# Patient Record
Sex: Female | Born: 2000 | Race: Black or African American | Hispanic: No | Marital: Single | Smoking: Never smoker
Health system: Southern US, Community
[De-identification: ages and names within clinical notes are randomized; demographics above are authoritative.]

## PROBLEM LIST (undated history)

## (undated) DIAGNOSIS — R569 Unspecified convulsions: Secondary | ICD-10-CM

## (undated) HISTORY — PX: BREAST CYST EXCISION: SHX579

## (undated) HISTORY — DX: Unspecified convulsions: R56.9

---
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
## 2019-11-30 ENCOUNTER — Other Ambulatory Visit: Payer: Self-pay

## 2019-11-30 ENCOUNTER — Emergency Department (HOSPITAL_BASED_OUTPATIENT_CLINIC_OR_DEPARTMENT_OTHER)
Admission: EM | Admit: 2019-11-30 | Discharge: 2019-11-30 | Disposition: A | Discharge status: Home / Self Care | Payer: BC Managed Care – PPO | Attending: Emergency Medicine | Admitting: Emergency Medicine

## 2019-11-30 ENCOUNTER — Encounter (HOSPITAL_BASED_OUTPATIENT_CLINIC_OR_DEPARTMENT_OTHER): Payer: Self-pay

## 2019-11-30 DIAGNOSIS — Z20822 Contact with and (suspected) exposure to covid-19: Secondary | ICD-10-CM | POA: Diagnosis not present

## 2019-11-30 DIAGNOSIS — R110 Nausea: Secondary | ICD-10-CM | POA: Insufficient documentation

## 2019-11-30 DIAGNOSIS — R42 Dizziness and giddiness: Secondary | ICD-10-CM

## 2019-11-30 DIAGNOSIS — R350 Frequency of micturition: Secondary | ICD-10-CM | POA: Diagnosis not present

## 2019-11-30 DIAGNOSIS — N3000 Acute cystitis without hematuria: Secondary | ICD-10-CM

## 2019-11-30 LAB — URINALYSIS, ROUTINE W REFLEX MICROSCOPIC
Bilirubin Urine: NEGATIVE
Glucose, UA: NEGATIVE mg/dL
Hgb urine dipstick: NEGATIVE
Ketones, ur: NEGATIVE mg/dL
Nitrite: NEGATIVE
Protein, ur: NEGATIVE mg/dL
Specific Gravity, Urine: 1.015 (ref 1.005–1.030)
pH: 8 (ref 5.0–8.0)

## 2019-11-30 LAB — CBC WITH DIFFERENTIAL/PLATELET
Abs Immature Granulocytes: 0.02 10*3/uL (ref 0.00–0.07)
Basophils Absolute: 0.1 10*3/uL (ref 0.0–0.1)
Basophils Relative: 1 %
Eosinophils Absolute: 0.1 10*3/uL (ref 0.0–0.5)
Eosinophils Relative: 2 %
HCT: 37.3 % (ref 36.0–46.0)
Hemoglobin: 12.9 g/dL (ref 12.0–15.0)
Immature Granulocytes: 0 %
Lymphocytes Relative: 18 %
Lymphs Abs: 1.2 10*3/uL (ref 0.7–4.0)
MCH: 28.5 pg (ref 26.0–34.0)
MCHC: 34.6 g/dL (ref 30.0–36.0)
MCV: 82.5 fL (ref 80.0–100.0)
Monocytes Absolute: 0.4 10*3/uL (ref 0.1–1.0)
Monocytes Relative: 7 %
Neutro Abs: 4.6 10*3/uL (ref 1.7–7.7)
Neutrophils Relative %: 72 %
Platelets: 199 10*3/uL (ref 150–400)
RBC: 4.52 MIL/uL (ref 3.87–5.11)
RDW: 11.6 % (ref 11.5–15.5)
WBC: 6.3 10*3/uL (ref 4.0–10.5)
nRBC: 0 % (ref 0.0–0.2)

## 2019-11-30 LAB — URINALYSIS, MICROSCOPIC (REFLEX)

## 2019-11-30 LAB — BASIC METABOLIC PANEL
Anion gap: 8 (ref 5–15)
BUN: 7 mg/dL (ref 6–20)
CO2: 24 mmol/L (ref 22–32)
Calcium: 9.1 mg/dL (ref 8.9–10.3)
Chloride: 104 mmol/L (ref 98–111)
Creatinine, Ser: 0.86 mg/dL (ref 0.44–1.00)
GFR, Estimated: >60 mL/min (ref >60)
Glucose, Bld: 91 mg/dL (ref 70–99)
Potassium: 3.8 mmol/L (ref 3.5–5.1)
Sodium: 136 mmol/L (ref 135–145)

## 2019-11-30 LAB — RESPIRATORY PANEL BY RT PCR (FLU A&B, COVID)
Influenza A by PCR: NEGATIVE
Influenza B by PCR: NEGATIVE
SARS Coronavirus 2 by RT PCR: NEGATIVE

## 2019-11-30 LAB — PREGNANCY, URINE: Preg Test, Ur: NEGATIVE

## 2019-11-30 MED ORDER — CEPHALEXIN 500 MG PO CAPS: 500.0000 mg | ORAL_CAPSULE | Freq: Two times a day (BID) | ORAL | 0 refills | Status: AC

## 2019-11-30 NOTE — Discharge Instructions (Addendum)
You were seen and evaluated in the emergency department today for your concern of dizziness.  Your vital signs, EKG, and blood work for very reassuring.  You are not anemic at this time.  Your urine test did show some infection in your urine.You have been prescribed 5 days of antibiotics outpatient.  Please complete the entire course.  You may follow-up with your primary care doctor if you are having persistent urinary symptoms plan.  Please return immediately to the emergency department if develop any new chest pain, shortness of breath, passing out, or other new severe symptoms.

## 2019-11-30 NOTE — ED Provider Notes (Signed)
[facility] EMERGENCY DEPARTMENT Provider Note   CSN: [id number] Arrival date & time: 11/30/19  5883     History No chief complaint on file.     Meghan Benton is a 19 y.o. female who presents with concern of waking up at 6:00 feeling very dizzy, nauseous for approximately 35 minutes this morning.  She states she dry heaved several times, but denies vomiting, diarrhea. Denies blurry vision / double vision.  She denies fevers and chills at home.  She denies dizziness or lightheadedness at this time.  She states she recently completed her menstrual cycle 11/20/2019.  She has history of iron deficiency anemia, secondary to heavy menstrual cycles.  She does take ferrous sulfate at home.   She states her symptoms have completely resolved at this time, and have not recurred since they stopped approximately 6:35 this morning (total duration 35 minutes).  However she presented to the emergency department for evaluation because when she has felt like this in the past she has subsequently had a "seizure."  She does endorse urinary frequency, denies urgency, denies dysuria.  Denies vaginal discharge or bleeding at this time.  She states she uses a contraceptive patch, she is currently sexually active.   I personally reviewed this patient's medical records.  She he has been seen multiple times for heavy menstrual cycles in the past, endorses vomiting with menstrual cycle.  History of seizure 2017 after feeling dizzy in the shower.  She was evaluated by pediatric neurology at that time.  She had a normal EEG at that time was not placed on any sort of anticonvulsant medication.  Neurologist recommended reducing her caffeine, started her on iron supplements.  She also had a normal work-up at the pediatric Cardiologist at that time.  HPI     Past Medical History:  Diagnosis Date  . Seizures (HCC)     There are no problems to display for this patient.   Past Surgical History:  Procedure  Laterality Date  . BREAST CYST EXCISION       OB History   No obstetric history on file.     Family History  Problem Relation Age of Onset  . Migraines Neg Hx   . Seizures Neg Hx   . Depression Neg Hx   . Anxiety disorder Neg Hx   . Bipolar disorder Neg Hx   . Schizophrenia Neg Hx   . ADD / ADHD Neg Hx   . Autism Neg Hx     Social History   Tobacco Use  . Smoking status: Never Smoker  . Smokeless tobacco: Never Used  Vaping Use  . Vaping Use: Never used  Substance Use Topics  . Alcohol use: No  . Drug use: No    Home Medications Prior to Admission medications   Medication Sig Start Date End Date Taking? Authorizing Provider  ferrous sulfate 325 (65 FE) MG EC tablet Take 2 tablets (650 mg total) by mouth daily with breakfast. 01/13/16   Lorenz Coaster, MD    Allergies    Patient has no known allergies.  Review of Systems   Review of Systems  Constitutional: Negative for activity change, appetite change, chills, diaphoresis, fatigue and fever.  HENT: Negative.   Respiratory: Negative for cough, chest tightness, shortness of breath and wheezing.   Cardiovascular: Negative for chest pain, palpitations and leg swelling.  Gastrointestinal: Positive for nausea. Negative for abdominal pain, diarrhea and vomiting.  Genitourinary: Positive for frequency. Negative for dysuria, flank pain, genital  sores, hematuria, urgency, vaginal bleeding, vaginal discharge and vaginal pain.  Musculoskeletal: Negative.  Negative for gait problem.  Skin: Negative.   Neurological: Positive for dizziness and light-headedness. Negative for seizures, syncope, weakness and headaches.  Hematological: Negative.     Physical Exam Updated Vital Signs BP 139/87 (BP Location: Right Arm)   Pulse 85   Temp 98.4 F (36.9 C)   Resp 16   Ht 5\' 7"  (1.702 m)   Wt 72.6 kg   LMP 11/16/2019   SpO2 99%   BMI 25.06 kg/m   Physical Exam Vitals and nursing note reviewed.  Constitutional:       Appearance: Normal appearance.  HENT:     Head: Normocephalic and atraumatic.     Nose: Nose normal.     Mouth/Throat:     Mouth: Mucous membranes are moist.     Pharynx: No oropharyngeal exudate or posterior oropharyngeal erythema.  Eyes:     General:        Right eye: No discharge.        Left eye: No discharge.     Extraocular Movements: Extraocular movements intact.     Conjunctiva/sclera: Conjunctivae normal.     Pupils: Pupils are equal, round, and reactive to light.  Cardiovascular:     Rate and Rhythm: Normal rate and regular rhythm.     Pulses: Normal pulses.     Heart sounds: Normal heart sounds. No murmur heard.   Pulmonary:     Effort: Pulmonary effort is normal. No respiratory distress.     Breath sounds: Normal breath sounds. No wheezing or rales.  Abdominal:     General: There is no distension.     Palpations: Abdomen is soft.     Tenderness: There is no abdominal tenderness. There is no guarding or rebound.  Musculoskeletal:        General: No deformity. Normal range of motion.     Cervical back: Neck supple. No rigidity.     Right lower leg: No edema.     Left lower leg: No edema.  Lymphadenopathy:     Cervical: No cervical adenopathy.  Skin:    General: Skin is warm and dry.     Capillary Refill: Capillary refill takes less than 2 seconds.     Coloration: Skin is not pale.     Findings: No bruising or rash.  Neurological:     General: No focal deficit present.     Mental Status: She is alert and oriented to person, place, and time.     Cranial Nerves: Cranial nerves are intact.     Sensory: Sensation is intact.     Motor: Motor function is intact.     Coordination: Coordination is intact.     Gait: Gait is intact.  Psychiatric:        Mood and Affect: Mood normal.     ED Results / Procedures / Treatments   Labs (all labs ordered are listed, but only abnormal results are displayed) Labs Reviewed  RESPIRATORY PANEL BY RT PCR (FLU A&B, COVID)    PREGNANCY, URINE  URINALYSIS, ROUTINE W REFLEX MICROSCOPIC  CBC WITH DIFFERENTIAL/PLATELET  BASIC METABOLIC PANEL    EKG None  Radiology No results found.  Procedures Procedures (including critical care time)  Medications Ordered in ED Medications - No data to display  ED Course  I have reviewed the triage vital signs and the nursing notes.  Pertinent labs & imaging results that were available during my care  of the patient were reviewed by me and considered in my medical decision making (see chart for details).    MDM Rules/Calculators/A&P                          19 year old female with history of iron deficiency anemia, concern for approximately 35 minutes of dizziness and nausea this morning.   Vital signs are normal on intake.  EKG normal sinus rhythm, no STEMI.  Cardiopulmonary exam is normal.  Neurologic exam is normal.  Physical exam is very reassuring.  CBC unremarkable, hemoglobin 12.9.  BMP unremarkable.  Pregnancy test negative  Respiratory pathogen panel negative for COVID-19, influenza A/B.  Urinalysis with trace leukocytes, many bacteria. Will treat with antibiotic for symptomatic bacteriuria, UTI.    Cardiac work-up is very reassuring.  At this time I do not feel any further work-up is necessary in the emergency department.  [place] voiced understanding of her medical evaluation and treatment plan.  Each of her questions were answered to her expressed satisfaction.  Encouraged her to follow-up with her primary care doctor if she continues to have urinary symptoms following completion of her antibiotic therapy.  Vital signs remained normal throughout the patient's stay in the emergency department.  Patient is stable for discharge.  Final Clinical Impression(s) / ED Diagnoses Final diagnoses:  None    Rx / DC Orders ED Discharge Orders    None       Sherrilee Gilles 11/30/19 2114    Terrilee Files, MD 12/01/19 [phone]

## 2019-11-30 NOTE — ED Notes (Signed)
Review D/C papers with pt, reviewed Rx with pt, pt states understanding, pt denies questions at this time. 

## 2019-11-30 NOTE — ED Triage Notes (Signed)
Pt complaint of after waking up this morning, pt complaint of feeling weak and dizzy that passed.

## 2019-12-02 LAB — URINE CULTURE

## 2019-12-03 LAB — URINE CULTURE: Culture: 70000 — AB

## 2019-12-04 ENCOUNTER — Telehealth: Payer: Self-pay | Admitting: *Deleted

## 2019-12-04 NOTE — Telephone Encounter (Signed)
Post ED Visit - Positive Culture Follow-up  Culture report reviewed by antimicrobial stewardship pharmacist: [facility] []  , Pharm.D. []  Enzo Bi, Pharm.D., BCPS AQ-ID []  , Pharm.D., BCPS []  Celedonio Miyamoto, Pharm.D., BCPS []  [place], Garvin Fila.D., BCPS, AAHIVP []  , Pharm.D., BCPS, AAHIVP []  Georgina Pillion, PharmD, BCPS []  , PharmD, BCPS []  [place], PharmD, BCPS []  [address], PharmD []  , PharmD, BCPS []  Estella Husk, PharmD  Pharmacy Team []  Lysle Pearl, PharmD []  , PharmD []  Phillips Climes, PharmD []  , Rph []  Agapito Games) , PharmD []  Verlan Friends, PharmD []  , PharmD []  Mervyn Gay, PharmD []  , PharmD []  Vinnie Level, PharmD []  [facility], PharmD []  , PharmD []  Len Childs, PharmD   Positive urine culture Treated with Cephalexin, organism sensitive to the same and no further patient follow-up is required at this time. , PharmD  Greer Pickerel Talley 12/04/2019, 8:40 AM

## 2019-12-11 ENCOUNTER — Emergency Department (HOSPITAL_BASED_OUTPATIENT_CLINIC_OR_DEPARTMENT_OTHER)
Admission: EM | Admit: 2019-12-11 | Discharge: 2019-12-11 | Disposition: A | Discharge status: Home / Self Care | Payer: BC Managed Care – PPO | Attending: Emergency Medicine | Admitting: Emergency Medicine

## 2019-12-11 ENCOUNTER — Encounter (HOSPITAL_BASED_OUTPATIENT_CLINIC_OR_DEPARTMENT_OTHER): Payer: Self-pay | Admitting: Emergency Medicine

## 2019-12-11 ENCOUNTER — Other Ambulatory Visit: Payer: Self-pay

## 2019-12-11 DIAGNOSIS — R112 Nausea with vomiting, unspecified: Secondary | ICD-10-CM | POA: Insufficient documentation

## 2019-12-11 DIAGNOSIS — N946 Dysmenorrhea, unspecified: Secondary | ICD-10-CM | POA: Insufficient documentation

## 2019-12-11 LAB — URINALYSIS, ROUTINE W REFLEX MICROSCOPIC
Glucose, UA: NEGATIVE mg/dL
Ketones, ur: 15 mg/dL — AB
Leukocytes,Ua: NEGATIVE
Nitrite: NEGATIVE
Protein, ur: NEGATIVE mg/dL
Specific Gravity, Urine: 1.02 (ref 1.005–1.030)
pH: 6.5 (ref 5.0–8.0)

## 2019-12-11 LAB — URINALYSIS, MICROSCOPIC (REFLEX)

## 2019-12-11 LAB — PREGNANCY, URINE: Preg Test, Ur: NEGATIVE

## 2019-12-11 MED ORDER — PROMETHAZINE HCL 25 MG PO TABS: 25.0000 mg | ORAL_TABLET | Freq: Three times a day (TID) | ORAL | 0 refills | Status: DC | PRN

## 2019-12-11 MED ORDER — ONDANSETRON HCL 4 MG/2ML IJ SOLN
4.0000 mg | Freq: Once | INTRAMUSCULAR | Status: AC
  Administered 2019-12-11: 4 mg via INTRAVENOUS
  Filled 2019-12-11: qty 2

## 2019-12-11 MED ORDER — SODIUM CHLORIDE 0.9 % IV BOLUS
1000.0000 mL | Freq: Once | INTRAVENOUS | Status: AC
  Administered 2019-12-11: 1000 mL via INTRAVENOUS

## 2019-12-11 NOTE — ED Provider Notes (Signed)
[facility] EMERGENCY DEPARTMENT Provider Note   CSN: [id number] Arrival date & time: 12/11/19  5397     History Chief Complaint  Patient presents with  . Emesis    Meghan Benton is a 19 y.o. female.  HPI Patient presents with nausea and vomiting.  Has been seen twice for this at [facility].  States that on her menses she used to have this issue when she was in high school.  States that she had to miss a lot of school because of it.  States she would miss months at a time.  States that since Sunday she has had nausea and vomiting.  Some abdominal cramping with it.  States she needs an IV fluid.  No diarrhea.  Pain is dull.  States the vomiting usually clears up after her menses stopped.  No lightheadedness or dizziness.  Still making urine.  Denies pregnancy.    Past Medical History:  Diagnosis Date  . Seizures (HCC)     There are no problems to display for this patient.   Past Surgical History:  Procedure Laterality Date  . BREAST CYST EXCISION       OB History   No obstetric history on file.     Family History  Problem Relation Age of Onset  . Migraines Neg Hx   . Seizures Neg Hx   . Depression Neg Hx   . Anxiety disorder Neg Hx   . Bipolar disorder Neg Hx   . Schizophrenia Neg Hx   . ADD / ADHD Neg Hx   . Autism Neg Hx     Social History   Tobacco Use  . Smoking status: Never Smoker  . Smokeless tobacco: Never Used  Vaping Use  . Vaping Use: Never used  Substance Use Topics  . Alcohol use: No  . Drug use: No    Home Medications Prior to Admission medications   Medication Sig Start Date End Date Taking? Authorizing Provider  ferrous sulfate 325 (65 FE) MG EC tablet Take 2 tablets (650 mg total) by mouth daily with breakfast. 01/13/16   Lorenz Coaster, MD  promethazine (PHENERGAN) 25 MG tablet Take 1 tablet (25 mg total) by mouth every 8 (eight) hours as needed for nausea. 12/11/19   Benjiman Core, MD    Allergies      Patient has no known allergies.  Review of Systems   Review of Systems  Constitutional: Positive for appetite change.  HENT: Negative for congestion.   Respiratory: Negative for shortness of breath.   Cardiovascular: Negative for chest pain.  Gastrointestinal: Positive for abdominal pain, nausea and vomiting. Negative for diarrhea.  Endocrine: Negative for polyuria.  Genitourinary: Positive for vaginal bleeding.  Musculoskeletal: Negative for back pain.  Skin: Negative for rash.  Neurological: Negative for weakness.  Psychiatric/Behavioral: Negative for confusion.    Physical Exam Updated Vital Signs BP (!) 126/91 (BP Location: Left Arm)   Pulse 76   Temp 97.8 F (36.6 C) (Oral)   Resp 17   Ht 5\' 7"  (1.702 m)   Wt 74.8 kg   LMP 11/16/2019   SpO2 99%   BMI 25.84 kg/m   Physical Exam Vitals and nursing note reviewed.  HENT:     Head: Normocephalic.     Mouth/Throat:     Mouth: Mucous membranes are moist.  Eyes:     General: No scleral icterus. Cardiovascular:     Rate and Rhythm: Regular rhythm.  Pulmonary:  Breath sounds: No wheezing or rhonchi.  Abdominal:     Tenderness: There is no abdominal tenderness.  Musculoskeletal:     Cervical back: Neck supple.     Right lower leg: No edema.     Left lower leg: No edema.  Skin:    General: Skin is warm.     Capillary Refill: Capillary refill takes less than 2 seconds.  Neurological:     Mental Status: She is alert and oriented to person, place, and time.     ED Results / Procedures / Treatments   Labs (all labs ordered are listed, but only abnormal results are displayed) Labs Reviewed  URINALYSIS, ROUTINE W REFLEX MICROSCOPIC - Abnormal; Notable for the following components:      Result Value   Color, Urine ORANGE (*)    Hgb urine dipstick LARGE (*)    Bilirubin Urine SMALL (*)    Ketones, ur 15 (*)    All other components within normal limits  URINALYSIS, MICROSCOPIC (REFLEX) - Abnormal; Notable for  the following components:   Bacteria, UA MANY (*)    All other components within normal limits  PREGNANCY, URINE    EKG None  Radiology No results found.  Procedures Procedures (including critical care time)  Medications Ordered in ED Medications  sodium chloride 0.9 % bolus 1,000 mL (1,000 mLs Intravenous New Bag/Given 12/11/19 0725)  ondansetron (ZOFRAN) injection 4 mg (4 mg Intravenous Given 12/11/19 0732)    ED Course  I have reviewed the triage vital signs and the nursing notes.  Pertinent labs & imaging results that were available during my care of the patient were reviewed by me and considered in my medical decision making (see chart for details).    MDM Rules/Calculators/A&P                          Patient presents with nausea and vomiting.  Some abdominal pain.  Has had recent work-up at [facility] for this.  States that she would often get this with her menses.  Reviewed previous blood work.  Urinalysis done here and showed 15 ketones.  Feels better and is tolerated some orals.  IV fluid given.  Has some bacteria in the urine but states she is already on medicine for this.  Will discharge with Phenergan.  Discharge home. Do not appear to have bowel obstruction, appendicitis, or diverticulitis. Final Clinical Impression(s) / ED Diagnoses Final diagnoses:  Non-intractable vomiting with nausea, unspecified vomiting type    Rx / DC Orders ED Discharge Orders         Ordered    promethazine (PHENERGAN) 25 MG tablet  Every 8 hours PRN        12/11/19 0840           Benjiman Core, MD 12/11/19 [phone]

## 2019-12-11 NOTE — ED Triage Notes (Signed)
Pt states "I need and IV". C/o n/v since sun. Pt was seen yesterday at [place] [facility] with blood work and urine done.

## 2020-03-19 ENCOUNTER — Encounter (HOSPITAL_BASED_OUTPATIENT_CLINIC_OR_DEPARTMENT_OTHER): Payer: Self-pay | Admitting: Emergency Medicine

## 2020-03-19 ENCOUNTER — Other Ambulatory Visit: Payer: Self-pay

## 2020-03-19 ENCOUNTER — Emergency Department (HOSPITAL_BASED_OUTPATIENT_CLINIC_OR_DEPARTMENT_OTHER)
Admission: EM | Admit: 2020-03-19 | Discharge: 2020-03-19 | Disposition: A | Discharge status: Home / Self Care | Payer: BC Managed Care – PPO | Attending: Emergency Medicine | Admitting: Emergency Medicine

## 2020-03-19 DIAGNOSIS — G40909 Epilepsy, unspecified, not intractable, without status epilepticus: Secondary | ICD-10-CM | POA: Diagnosis not present

## 2020-03-19 DIAGNOSIS — Z79899 Other long term (current) drug therapy: Secondary | ICD-10-CM | POA: Insufficient documentation

## 2020-03-19 DIAGNOSIS — R569 Unspecified convulsions: Secondary | ICD-10-CM | POA: Diagnosis present

## 2020-03-19 LAB — CBC WITH DIFFERENTIAL/PLATELET
Abs Immature Granulocytes: 0.02 10*3/uL (ref 0.00–0.07)
Basophils Absolute: 0.1 10*3/uL (ref 0.0–0.1)
Basophils Relative: 1 %
Eosinophils Absolute: 0.2 10*3/uL (ref 0.0–0.5)
Eosinophils Relative: 2 %
HCT: 35.6 % — ABNORMAL LOW (ref 36.0–46.0)
Hemoglobin: 12.1 g/dL (ref 12.0–15.0)
Immature Granulocytes: 0 %
Lymphocytes Relative: 28 %
Lymphs Abs: 2.1 10*3/uL (ref 0.7–4.0)
MCH: 27.9 pg (ref 26.0–34.0)
MCHC: 34 g/dL (ref 30.0–36.0)
MCV: 82.2 fL (ref 80.0–100.0)
Monocytes Absolute: 0.7 10*3/uL (ref 0.1–1.0)
Monocytes Relative: 10 %
Neutro Abs: 4.4 10*3/uL (ref 1.7–7.7)
Neutrophils Relative %: 59 %
Platelets: 219 10*3/uL (ref 150–400)
RBC: 4.33 MIL/uL (ref 3.87–5.11)
RDW: 11.9 % (ref 11.5–15.5)
WBC: 7.5 10*3/uL (ref 4.0–10.5)
nRBC: 0 % (ref 0.0–0.2)

## 2020-03-19 LAB — RAPID URINE DRUG SCREEN, HOSP PERFORMED
Amphetamines: NOT DETECTED
Barbiturates: NOT DETECTED
Benzodiazepines: NOT DETECTED
Cocaine: NOT DETECTED
Opiates: NOT DETECTED
Tetrahydrocannabinol: POSITIVE — AB

## 2020-03-19 LAB — BASIC METABOLIC PANEL
Anion gap: 9 (ref 5–15)
BUN: 9 mg/dL (ref 6–20)
CO2: 24 mmol/L (ref 22–32)
Calcium: 9.3 mg/dL (ref 8.9–10.3)
Chloride: 103 mmol/L (ref 98–111)
Creatinine, Ser: 0.77 mg/dL (ref 0.44–1.00)
GFR, Estimated: >60 mL/min (ref >60)
Glucose, Bld: 133 mg/dL — ABNORMAL HIGH (ref 70–99)
Potassium: 3.2 mmol/L — ABNORMAL LOW (ref 3.5–5.1)
Sodium: 136 mmol/L (ref 135–145)

## 2020-03-19 LAB — PREGNANCY, URINE: Preg Test, Ur: NEGATIVE

## 2020-03-19 MED ORDER — LEVETIRACETAM 500 MG PO TABS: 500.0000 mg | ORAL_TABLET | Freq: Two times a day (BID) | ORAL | 0 refills | Status: DC

## 2020-03-19 MED ORDER — LEVETIRACETAM IN NACL 1000 MG/100ML IV SOLN
1000.0000 mg | Freq: Once | INTRAVENOUS | Status: AC
  Administered 2020-03-19: 1000 mg via INTRAVENOUS
  Filled 2020-03-19: qty 100

## 2020-03-19 NOTE — Discharge Instructions (Signed)
No driving for six months or until cleared by neurology.   

## 2020-03-19 NOTE — ED Provider Notes (Signed)
[facility] EMERGENCY DEPARTMENT Provider Note   CSN: [id number] Arrival date & time: 03/19/20  0012     History Chief Complaint  Patient presents with  . Seizures    Meghan Benton is a 20 y.o. female.  The history is provided by the patient.  Seizures Seizure activity on arrival: no   Seizure type:  Grand mal Preceding symptoms: no sensation of an aura present   Initial focality:  None Episode characteristics: abnormal movements and generalized shaking   Episode characteristics: no incontinence   Postictal symptoms: somnolence   Return to baseline: yes   Severity:  Moderate Timing:  Once Number of seizures this episode:  1 Progression:  Resolved Context: drug use   Context: not family hx of seizures and not fever   Recent head injury:  No recent head injuries PTA treatment:  None History of seizures: yes        Past Medical History:  Diagnosis Date  . Seizures (HCC)     There are no problems to display for this patient.   Past Surgical History:  Procedure Laterality Date  . BREAST CYST EXCISION       OB History   No obstetric history on file.     Family History  Problem Relation Age of Onset  . Migraines Neg Hx   . Seizures Neg Hx   . Depression Neg Hx   . Anxiety disorder Neg Hx   . Bipolar disorder Neg Hx   . Schizophrenia Neg Hx   . ADD / ADHD Neg Hx   . Autism Neg Hx     Social History   Tobacco Use  . Smoking status: Never Smoker  . Smokeless tobacco: Never Used  Vaping Use  . Vaping Use: Never used  Substance Use Topics  . Alcohol use: No  . Drug use: No    Home Medications Prior to Admission medications   Medication Sig Start Date End Date Taking? Authorizing Provider  levETIRAcetam (KEPPRA) 500 MG tablet Take 1 tablet (500 mg total) by mouth 2 (two) times daily. 03/19/20  Yes Farida Mcreynolds, MD  ferrous sulfate 325 (65 FE) MG EC tablet Take 2 tablets (650 mg total) by mouth daily with breakfast. 01/13/16   Lorenz Coaster, MD  promethazine (PHENERGAN) 25 MG tablet Take 1 tablet (25 mg total) by mouth every 8 (eight) hours as needed for nausea. 12/11/19   Benjiman Core, MD    Allergies    Patient has no known allergies.  Review of Systems   Review of Systems  Constitutional: Negative for fever.  HENT: Negative for congestion.   Eyes: Negative for visual disturbance.  Respiratory: Negative for shortness of breath.   Cardiovascular: Negative for chest pain.  Gastrointestinal: Negative for abdominal pain.  Genitourinary: Negative for difficulty urinating.  Musculoskeletal: Negative for arthralgias.  Skin: Negative for rash.  Neurological: Positive for seizures.  Psychiatric/Behavioral: Negative for agitation.  All other systems reviewed and are negative.   Physical Exam Updated Vital Signs BP 119/73   Pulse 95   Temp 98.4 F (36.9 C) (Oral)   Resp 20   Ht 5\' 7"  (1.702 m)   Wt 77.1 kg   SpO2 99%   BMI 26.63 kg/m   Physical Exam Vitals and nursing note reviewed.  Constitutional:      General: She is not in acute distress.    Appearance: Normal appearance.  HENT:     Head: Normocephalic and atraumatic.  Nose: Nose normal.  Eyes:     Extraocular Movements: Extraocular movements intact.     Conjunctiva/sclera: Conjunctivae normal.  Cardiovascular:     Rate and Rhythm: Normal rate and regular rhythm.     Pulses: Normal pulses.     Heart sounds: Normal heart sounds.  Pulmonary:     Effort: Pulmonary effort is normal.     Breath sounds: Normal breath sounds.  Abdominal:     General: Abdomen is flat. Bowel sounds are normal.     Palpations: Abdomen is soft.     Tenderness: There is no abdominal tenderness. There is no guarding.  Musculoskeletal:        General: Normal range of motion.     Cervical back: Normal range of motion and neck supple.  Skin:    General: Skin is warm and dry.     Capillary Refill: Capillary refill takes less than 2 seconds.  Neurological:      Mental Status: She is alert.     Deep Tendon Reflexes: Reflexes normal.  Psychiatric:        Mood and Affect: Mood normal.        Behavior: Behavior normal.     ED Results / Procedures / Treatments   Labs (all labs ordered are listed, but only abnormal results are displayed) Results for orders placed or performed during the hospital encounter of 03/19/20  CBC with Differential/Platelet  Result Value Ref Range   WBC 7.5 4.0 - 10.5 K/uL   RBC 4.33 3.87 - 5.11 MIL/uL   Hemoglobin 12.1 12.0 - 15.0 g/dL   HCT 93.7 (L) 90.2 - 40.9 %   MCV 82.2 80.0 - 100.0 fL   MCH 27.9 26.0 - 34.0 pg   MCHC 34.0 30.0 - 36.0 g/dL   RDW 73.5 32.9 - 92.4 %   Platelets 219 150 - 400 K/uL   nRBC 0.0 0.0 - 0.2 %   Neutrophils Relative % 59 %   Neutro Abs 4.4 1.7 - 7.7 K/uL   Lymphocytes Relative 28 %   Lymphs Abs 2.1 0.7 - 4.0 K/uL   Monocytes Relative 10 %   Monocytes Absolute 0.7 0.1 - 1.0 K/uL   Eosinophils Relative 2 %   Eosinophils Absolute 0.2 0.0 - 0.5 K/uL   Basophils Relative 1 %   Basophils Absolute 0.1 0.0 - 0.1 K/uL   Immature Granulocytes 0 %   Abs Immature Granulocytes 0.02 0.00 - 0.07 K/uL  Basic metabolic panel  Result Value Ref Range   Sodium 136 135 - 145 mmol/L   Potassium 3.2 (L) 3.5 - 5.1 mmol/L   Chloride 103 98 - 111 mmol/L   CO2 24 22 - 32 mmol/L   Glucose, Bld 133 (H) 70 - 99 mg/dL   BUN 9 6 - 20 mg/dL   Creatinine, Ser 2.68 0.44 - 1.00 mg/dL   Calcium 9.3 8.9 - 34.1 mg/dL   GFR, Estimated >96 >22 mL/min   Anion gap 9 5 - 15  Pregnancy, urine  Result Value Ref Range   Preg Test, Ur NEGATIVE NEGATIVE  Rapid urine drug screen (hospital performed)  Result Value Ref Range   Opiates NONE DETECTED NONE DETECTED   Cocaine NONE DETECTED NONE DETECTED   Benzodiazepines NONE DETECTED NONE DETECTED   Amphetamines NONE DETECTED NONE DETECTED   Tetrahydrocannabinol POSITIVE (A) NONE DETECTED   Barbiturates NONE DETECTED NONE DETECTED   No results  found.  None  Radiology No results found.  Procedures Procedures  Medications Ordered in ED Medications  levETIRAcetam (KEPPRA) IVPB 1000 mg/100 mL premix ( Intravenous Stopped 03/19/20 0114)    ED Course  I have reviewed the triage vital signs and the nursing notes.  Pertinent labs & imaging results that were available during my care of the patient were reviewed by me and considered in my medical decision making (see chart for details).   Patient has a known seizure disorder but stopped her medications. Patient and mother informed verbally and in writing patient is not to drive for 6 months or until cleared by neurology.  Keppra initiated and referral made to neurologist.  Informed no alcohol or drugs as this will lower the seizure threshold.     ADYA WIRZ was evaluated in Emergency Department on 03/19/2020 for the symptoms described in the history of present illness. She was evaluated in the context of the global COVID-19 pandemic, which necessitated consideration that the patient might be at risk for infection with the SARS-CoV-2 virus that causes COVID-19. Institutional protocols and algorithms that pertain to the evaluation of patients at risk for COVID-19 are in a state of rapid change based on information released by regulatory bodies including the CDC and federal and state organizations. These policies and algorithms were followed during the patient's care in the ED.  Final Clinical Impression(s) / ED Diagnoses Final diagnoses:  Seizure disorder (HCC)   Return for intractable cough, coughing up blood, fevers >100.4 unrelieved by medication, shortness of breath, intractable vomiting, chest pain, shortness of breath, weakness, numbness, changes in speech, facial asymmetry, abdominal pain, passing out, Inability to tolerate liquids or food, cough, altered mental status or any concerns. No signs of systemic illness or infection. The patient is nontoxic-appearing on exam and  vital signs are within normal limits.  I have reviewed the triage vital signs and the nursing notes. Pertinent labs & imaging results that were available during my care of the patient were reviewed by me and considered in my medical decision making (see chart for details). After history, exam, and medical workup I feel the patient has been appropriately medically screened and is safe for discharge home. Pertinent diagnoses were discussed with the patient. Patient was given return precautions. Rx / DC Orders ED Discharge Orders         Ordered    levETIRAcetam (KEPPRA) 500 MG tablet  2 times daily        03/19/20 0119           Joseph Johns, MD 03/19/20 [phone]

## 2020-03-19 NOTE — ED Triage Notes (Signed)
Pt feels like she is having seizures. 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

## 2020-03-22 ENCOUNTER — Encounter: Payer: Self-pay | Admitting: Neurology

## 2020-03-29 ENCOUNTER — Ambulatory Visit (INDEPENDENT_AMBULATORY_CARE_PROVIDER_SITE_OTHER): Payer: BC Managed Care – PPO | Admitting: Neurology

## 2020-03-29 ENCOUNTER — Other Ambulatory Visit: Payer: Self-pay

## 2020-03-29 ENCOUNTER — Encounter: Payer: Self-pay | Admitting: Neurology

## 2020-03-29 VITALS — BP 111/75 | HR 85 | Ht 67.0 in | Wt 162.0 lb

## 2020-03-29 DIAGNOSIS — R251 Tremor, unspecified: Secondary | ICD-10-CM

## 2020-03-29 DIAGNOSIS — R569 Unspecified convulsions: Secondary | ICD-10-CM | POA: Diagnosis not present

## 2020-03-29 MED ORDER — LEVETIRACETAM 500 MG PO TABS: ORAL_TABLET | ORAL | 5 refills | Status: DC

## 2020-03-29 NOTE — Patient Instructions (Signed)
1. Schedule 1-hour EEG, if normal we will do a 72-hour EEG  2. Schedule MRI brain with and without contrast  3. Increase Keppra 500mg : take 1 and 1/2 tablets twice a day  4. Start a daily folic acid 1mg  supplement  5. Follow-up after tests, call for any changes   Seizure Precautions: 1. If medication has been prescribed for you to prevent seizures, take it exactly as directed.  Do not stop taking the medicine without talking to your doctor first, even if you have not had a seizure in a long time.   2. Avoid activities in which a seizure would cause danger to yourself or to others.  Don't operate dangerous machinery, swim alone, or climb in high or dangerous places, such as on ladders, roofs, or girders.  Do not drive unless your doctor says you may.  3. If you have any warning that you may have a seizure, lay down in a safe place where you can't hurt yourself.    4.  No driving for 6 months from last seizure, as per [facility].   Please refer to the following link on the [employer]ile[employer]'s website for more information: http://www.epilepsyfoundation.org/answerplace/Social/driving/drivingu.cfm   5.  Maintain good sleep hygiene. Avoid alcohol.  6.  Notify your neurology if you are planning pregnancy or if you become pregnant.  7.  Contact your doctor if you have any problems that may be related to the medicine you are taking.  8.  Call 911 and bring the patient back to the ED if:        A.  The seizure lasts longer than 5 minutes.       B.  The patient doesn't awaken shortly after the seizure  C.  The patient has new problems such as difficulty seeing, speaking or moving  D.  The patient was injured during the seizure  E.  The patient has a temperature over 102 F (39C)  F.  The patient vomited and now is having trouble breathing

## 2020-03-29 NOTE — Progress Notes (Signed)
NEUROLOGY CONSULTATION NOTE  Meghan Benton MRN: [id number] DOB: [birth year]  Referring provider: Dr. April Palumbo (ER) Primary care provider: Dr. Andrey Cota  Reason for consult:  seizure  Dear Dr Nicanor Alcon:  Thank you for your kind referral of Meghan Benton for consultation of the above symptoms. Although her history is well known to you, please allow me to reiterate it for the purpose of our medical record. The patient was accompanied to the clinic by her mother who also provides collateral information. Records and images were personally reviewed where available.   HISTORY OF PRESENT ILLNESS: This is a pleasant 20 year old right-handed woman with a history of iron deficiency anemia, cyclic vomiting, presenting for evaluation of seizures. Records were reviewed, she was evaluated in 2017 by pediatric neurologist Dr. Artis Flock. The first episode occurred in 12/2015, she was in the shower and recalls feeling dizzy. Her parents heard a thump and came to check her, she was wobbly and stumbling, her mother reports she was staring off and not responding then fell again and started having generalized shaking/stiffening for less than 5 minutes. She bit her tongue, her mouth was sore, no focal weakness. When EMS arrived, she was out of it. She was brought to the ER where bloodwork was normal except for mild anemia, head CT normal. She was discharged home on Keppra. She had an EEG which was normal, and on evaluation with Dr. Artis Flock, since this was her first seizure with normal EEG and brain imaging, Keppra was discontinued. She was event-free for 4 years until 11/2019 when she had a nocturnal seizure. She recalls feeling really sleepy on the couch, fell asleep, then her sister heard her making a noise and found her shaking. Her mouth was sore again but she did not bite her tongue, no incontinence. Her mother reports a few minutes later, she had another seizure, then when EMS arrived and walked her to  the ambulance, her mother could see her body twitching then she had another convulsion. She was in the ER several times in a week in November, her mother reports these were all for seizures, ER notes indicate vomiting with nausea. Per ER notes, she was noted to be tremulous and states she was weak and uncomfortable, responsive to staff. Symptoms quieted down until January 2022 when she had another nocturnal convulsion, it woke her boyfriend up and he said she was not responding. She has had an increase in frequency of shaking episodes last month, they report they tend to occur around/after her menstrual period, on 2/19 she started feeling dizzy then her body started shaking (she demonstrates tremulousness in hands), she was awake but could not control her muscles. When her mother arrived, she was still shaking and brought her to the ER where she was started on Levetiracetam 500mg  BID. She was brought to [facility] ER on 03/24/19 when she had another episode as her mother was driving her to work, she told her mother she felt dizzy and shaky, this lasted for a minute or so. Head CT done at ER was normal. She had another the next day, and most recently last Friday when she had 2 where her mother had to pick her up from work. Her mother reports the recent episodes are different, she is trembling, eyes blinking uncontrollably with face also twitching, but she is awake and aware during them. She has some drowsiness on the LEV and it may be making her eczema flare up. Her mother denies any isolated episodes  of staring/unresponsiveness. She denies any headaches. She has occasional dizziness. She denies any diplopia, dysarthria/dysphagia, neck/back pain, bowel/bladder dysfunction. Memory is good. Sleep and mood are good. She works at [employer].   Epilepsy Risk Factors:  A maternal cousin has seizures. She had a normal birth and early development.  There is no history of febrile convulsions, CNS infections such as  meningitis/encephalitis, significant traumatic brain injury, neurosurgical procedures.    PAST MEDICAL HISTORY: Past Medical History:  Diagnosis Date  . Seizures (HCC)     PAST SURGICAL HISTORY: Past Surgical History:  Procedure Laterality Date  . BREAST CYST EXCISION      MEDICATIONS: Current Outpatient Medications on File Prior to Visit  Medication Sig Dispense Refill  . ferrous sulfate 325 (65 FE) MG EC tablet Take 2 tablets (650 mg total) by mouth daily with breakfast. 60 tablet 3  . levETIRAcetam (KEPPRA) 500 MG tablet Take 1 tablet (500 mg total) by mouth 2 (two) times daily. 60 tablet 0  . promethazine (PHENERGAN) 25 MG tablet Take 1 tablet (25 mg total) by mouth every 8 (eight) hours as needed for nausea. 8 tablet 0   No current facility-administered medications on file prior to visit.    ALLERGIES: No Known Allergies  FAMILY HISTORY: Family History  Problem Relation Age of Onset  . Migraines Neg Hx   . Seizures Neg Hx   . Depression Neg Hx   . Anxiety disorder Neg Hx   . Bipolar disorder Neg Hx   . Schizophrenia Neg Hx   . ADD / ADHD Neg Hx   . Autism Neg Hx     SOCIAL HISTORY: Social History   Socioeconomic History  . Marital status: Single    Spouse name: Not on file  . Number of children: Not on file  . Years of education: Not on file  . Highest education level: Not on file  Occupational History  . Not on file  Tobacco Use  . Smoking status: Never Smoker  . Smokeless tobacco: Never Used  Vaping Use  . Vaping Use: Never used  Substance and Sexual Activity  . Alcohol use: No  . Drug use: No  . Sexual activity: Not on file  Other Topics Concern  . Not on file  Social History Narrative   Grade:11th   School Name:[employer]   How does patient do in school: good   Patient lives with: father, mother and sister(s)   Does patient have and IEP/504 Plan in school? Yes   If so, is the patient meeting goals? Yes   Does patient receive  therapies? No   What are the patient's hobbies or interest? Cheerleading, Singing, Dancing   Social Determinants of Health   Financial Resource Strain: Not on file  Food Insecurity: Not on file  Transportation Needs: Not on file  Physical Activity: Not on file  Stress: Not on file  Social Connections: Not on file  Intimate Partner Violence: Not on file     PHYSICAL EXAM: Vitals:   03/29/20 1305  BP: 111/75  Pulse: 85  SpO2: 100%   General: No acute distress Head:  Normocephalic/atraumatic Skin/Extremities: No rash, no edema Neurological Exam: Mental status: alert and oriented to person, place, and time, no dysarthria or aphasia, Fund of knowledge is appropriate.  Recent and remote memory are intact, 3/3 delayed recall.  Attention and concentration are normal, 5/5 WORLD backwards.  Cranial nerves: CN I: not tested CN II: pupils equal, round and reactive to light,  visual fields intact CN III, IV, VI:  full range of motion, no nystagmus, no ptosis CN V: facial sensation intact CN VII: upper and lower face symmetric CN VIII: hearing intact to conversation Bulk & Tone: normal, no fasciculations. Motor: 5/5 throughout with no pronator drift. Sensation: intact to light touch, cold, pin, vibration and joint position sense.  No extinction to double simultaneous stimulation.  Romberg test negative Deep Tendon Reflexes: +1 throughout Cerebellar: no incoordination on finger to nose testing Gait: narrow-based and steady, able to tandem walk adequately. Tremor: none   IMPRESSION: This is a pleasant 20 year old right-handed woman with a history of iron deficiency anemia, cyclic vomiting, presenting for evaluation of seizures. She had 3 seizures in one day in 2017, EEG at that time normal. She had recurrence of symptoms in 11/2019 with nocturnal seizures as well as recent episodes of body shaking/tremulousness without loss of awareness. Etiology of seizures unclear, MRI brain with and  without contrast and 1-hour EEG will be ordered to assess for focal abnormalities that increase risk for recurrent seizures. She is on Levetiracetam, they report near-daily episodes last week, increase to 750mg  BID. If EEG is normal, we will do a 72-hour EEG to further characterize these episodes, we discussed different causes of seizures, including non-epileptic events. She has no pregnancy plans, not on contraception, discussed starting folic acid while taking Levetiracetam. [place] driving laws were discussed with the patient, and she knows to stop driving after a seizure, until 6 months seizure-free. Follow-up after tests, call for any changes.   Thank you for allowing me to participate in the care of this patient. Please do not hesitate to call for any questions or concerns.   , M.D.  CC: Dr. Patrcia Dolly, Dr. Nicanor Alcon

## 2020-03-30 ENCOUNTER — Ambulatory Visit (INDEPENDENT_AMBULATORY_CARE_PROVIDER_SITE_OTHER): Payer: BC Managed Care – PPO | Admitting: Neurology

## 2020-03-30 DIAGNOSIS — R251 Tremor, unspecified: Secondary | ICD-10-CM

## 2020-03-30 DIAGNOSIS — R569 Unspecified convulsions: Secondary | ICD-10-CM | POA: Diagnosis not present

## 2020-04-04 ENCOUNTER — Other Ambulatory Visit: Payer: Self-pay

## 2020-04-04 ENCOUNTER — Ambulatory Visit
Admission: RE | Admit: 2020-04-04 | Discharge: 2020-04-04 | Disposition: A | Discharge status: Home / Self Care | Payer: BC Managed Care – PPO | Source: Ambulatory Visit | Attending: Neurology | Admitting: Neurology

## 2020-04-04 DIAGNOSIS — R251 Tremor, unspecified: Secondary | ICD-10-CM

## 2020-04-04 DIAGNOSIS — R569 Unspecified convulsions: Secondary | ICD-10-CM

## 2020-04-04 MED ORDER — GADOBENATE DIMEGLUMINE 529 MG/ML IV SOLN
15.0000 mL | Freq: Once | INTRAVENOUS | Status: AC | PRN
  Administered 2020-04-04: 15 mL via INTRAVENOUS

## 2020-04-06 ENCOUNTER — Ambulatory Visit: Payer: BC Managed Care – PPO | Admitting: Neurology

## 2020-04-07 ENCOUNTER — Telehealth: Payer: Self-pay | Admitting: Neurology

## 2020-04-07 MED ORDER — LEVETIRACETAM 1000 MG PO TABS: 1000.0000 mg | ORAL_TABLET | Freq: Two times a day (BID) | ORAL | 5 refills | Status: DC

## 2020-04-07 NOTE — Telephone Encounter (Signed)
Called patients mother and informed her of Dr. Rosalyn Gess recommendations. Patients mother was also advised that Darl Pikes is out until Monday and we will check with her then to see if patients EEG can be moved up. Also informed patients mother that Keppra was sent to the pharmacy.

## 2020-04-07 NOTE — Telephone Encounter (Signed)
Patient's mother states that patient is still having multiple seizures frequently. She was seen in the ED just a couple of days ago. They are asking if her upcoming EEG appt can be done sooner or if she could be admitted for observation? She states that patient is taking her seizure med as instructed and she's still having the seizures. She is concerned for patient and requests a call back.

## 2020-04-07 NOTE — Telephone Encounter (Signed)
Pls let mother know the EEG was normal, however it is not like a pregnancy test that is positive or negative. I will ask Darl Pikes if we can move up the EEG but she is out until Monday. Would increase the Keppra one more time to 1000mg  BID. I sent in a new prescription. Thanks

## 2020-04-15 ENCOUNTER — Other Ambulatory Visit: Payer: Self-pay

## 2020-04-15 ENCOUNTER — Ambulatory Visit (INDEPENDENT_AMBULATORY_CARE_PROVIDER_SITE_OTHER): Payer: BC Managed Care – PPO | Admitting: Neurology

## 2020-04-15 DIAGNOSIS — R569 Unspecified convulsions: Secondary | ICD-10-CM | POA: Diagnosis not present

## 2020-04-15 DIAGNOSIS — R251 Tremor, unspecified: Secondary | ICD-10-CM

## 2020-04-15 NOTE — Procedures (Signed)
ELECTROENCEPHALOGRAM REPORT  Date of Study: 03/30/2020  Patient's Name: Meghan Benton MRN: [id number] Date of Birth: Aug 26, 2000  Referring Provider: Dr. Patrcia Dolly  Clinical History: This is a 20 year old woman with recurrent episodes of staring, shaking, as well as different episodes of  trembling, eyes blinking uncontrollably with face also twitching, but she is awake and aware during them.   Medications: 65 FE MG EC tablet KEPPRA 500 MG tablet PHENERGAN 25 MG tablet   Technical Summary: A multichannel digital 1-hour EEG recording measured by the international 10-20 system with electrodes applied with paste and impedances below 5000 ohms performed in our laboratory with EKG monitoring in an awake and asleep patient.  Hyperventilation was not performed. Photic stimulation was performed.  The digital EEG was referentially recorded, reformatted, and digitally filtered in a variety of bipolar and referential montages for optimal display.    Description: The patient is awake and asleep during the recording.  During maximal wakefulness, there is a symmetric, medium voltage 10 Hz posterior dominant rhythm that attenuates with eye opening.  The record is symmetric.  During drowsiness and sleep, there is an increase in theta slowing of the background.  Vertex waves and symmetric sleep spindles were seen.  Photic stimulation elicited excellent driving response, no abnormalities.  There were no epileptiform discharges or electrographic seizures seen.    EKG lead was unremarkable.  Impression: This 1-hour awake and asleep EEG is normal.    Clinical Correlation: A normal EEG does not exclude a clinical diagnosis of epilepsy.  If further clinical questions remain, prolonged EEG may be helpful.  Clinical correlation is advised.   Patrcia Dolly, M.D.

## 2020-04-21 ENCOUNTER — Telehealth: Payer: Self-pay | Admitting: Neurology

## 2020-04-21 NOTE — Telephone Encounter (Signed)
She is scheduled for a f/u tomorrow to discuss results. Thanks

## 2020-04-21 NOTE — Telephone Encounter (Signed)
Patient called for recent 72 hour EEG results.

## 2020-04-22 ENCOUNTER — Ambulatory Visit (INDEPENDENT_AMBULATORY_CARE_PROVIDER_SITE_OTHER): Payer: BC Managed Care – PPO | Admitting: Neurology

## 2020-04-22 ENCOUNTER — Encounter: Payer: Self-pay | Admitting: Neurology

## 2020-04-22 ENCOUNTER — Other Ambulatory Visit: Payer: Self-pay

## 2020-04-22 VITALS — BP 112/71 | HR 92 | Ht 67.0 in | Wt 170.4 lb

## 2020-04-22 DIAGNOSIS — F419 Anxiety disorder, unspecified: Secondary | ICD-10-CM

## 2020-04-22 DIAGNOSIS — R569 Unspecified convulsions: Secondary | ICD-10-CM | POA: Diagnosis not present

## 2020-04-22 DIAGNOSIS — F32A Depression, unspecified: Secondary | ICD-10-CM | POA: Diagnosis not present

## 2020-04-22 NOTE — Progress Notes (Signed)
Still having seizures

## 2020-04-22 NOTE — Progress Notes (Signed)
NEUROLOGY FOLLOW UP OFFICE NOTE  Meghan Benton [id number] 01-Nov-2000  HISTORY OF PRESENT ILLNESS: I had the pleasure of seeing Frank Pilger in follow-up in the neurology clinic on 04/22/2020.  The patient was last seen 3 weeks ago for recurrent seizures. She is again accompanied by her mother who helps supplement the history today.  Records and images were personally reviewed where available.  I personally reviewed MRI brain with and without contrast done 03/2020 which was normal. She had a 68-hour ambulatory EEG done last week which was normal. Typical events were not captured, however there were push button events where she reports hand shake/eye twitch and appears tired/lethargic, eyes rolling upward with no EEG changes seen. Her mother asks about stress-related events, they have noticed the episodes usually occur before she is going to work in the morning. Work has been very stressful, she has been off the headset the past 2 days with less stress. The last episode was yesterday morning, she was not aware of it, eyes were closed with head shaking side to side. She is still taking the Levetiracetam 1000mg  BID.   History on Initial Assessment 03/29/2020: This is a pleasant 20 year old right-handed woman with a history of iron deficiency anemia, cyclic vomiting, presenting for evaluation of seizures. Records were reviewed, she was evaluated in 2017 by pediatric neurologist Dr. 2018. The first episode occurred in 12/2015, she was in the shower and recalls feeling dizzy. Her parents heard a thump and came to check her, she was wobbly and stumbling, her mother reports she was staring off and not responding then fell again and started having generalized shaking/stiffening for less than 5 minutes. She bit her tongue, her mouth was sore, no focal weakness. When EMS arrived, she was out of it. She was brought to the ER where bloodwork was normal except for mild anemia, head CT normal. She was discharged home on  Keppra. She had an EEG which was normal, and on evaluation with Dr. 01/2016, since this was her first seizure with normal EEG and brain imaging, Keppra was discontinued. She was event-free for 4 years until 11/2019 when she had a nocturnal seizure. She recalls feeling really sleepy on the couch, fell asleep, then her sister heard her making a noise and found her shaking. Her mouth was sore again but she did not bite her tongue, no incontinence. Her mother reports a few minutes later, she had another seizure, then when EMS arrived and walked her to the ambulance, her mother could see her body twitching then she had another convulsion. She was in the ER several times in a week in November, her mother reports these were all for seizures, ER notes indicate vomiting with nausea. Per ER notes, she was noted to be tremulous and states she was weak and uncomfortable, responsive to staff. Symptoms quieted down until January 2022 when she had another nocturnal convulsion, it woke her boyfriend up and he said she was not responding. She has had an increase in frequency of shaking episodes last month, they report they tend to occur around/after her menstrual period, on 2/19 she started feeling dizzy then her body started shaking (she demonstrates tremulousness in hands), she was awake but could not control her muscles. When her mother arrived, she was still shaking and brought her to the ER where she was started on Levetiracetam 500mg  BID. She was brought to [facility] ER on 03/24/19 when she had another episode as her mother was driving her to work,  she told her mother she felt dizzy and shaky, this lasted for a minute or so. Head CT done at ER was normal. She had another the next day, and most recently last Friday when she had 2 where her mother had to pick her up from work. Her mother reports the recent episodes are different, she is trembling, eyes blinking uncontrollably with face also twitching, but she is awake and aware during  them. She has some drowsiness on the LEV and it may be making her eczema flare up. Her mother denies any isolated episodes of staring/unresponsiveness. She denies any headaches. She has occasional dizziness. She denies any diplopia, dysarthria/dysphagia, neck/back pain, bowel/bladder dysfunction. Memory is good. Sleep and mood are good. She works at [employer].   Epilepsy Risk Factors:  A maternal cousin has seizures. She had a normal birth and early development.  There is no history of febrile convulsions, CNS infections such as meningitis/encephalitis, significant traumatic brain injury, neurosurgical procedures.    PAST MEDICAL HISTORY: Past Medical History:  Diagnosis Date  . Seizures (HCC)     MEDICATIONS: Current Outpatient Medications on File Prior to Visit  Medication Sig Dispense Refill  . levETIRAcetam (KEPPRA) 1000 MG tablet Take 1 tablet (1,000 mg total) by mouth 2 (two) times daily. 60 tablet 5  . promethazine (PHENERGAN) 25 MG tablet Take 1 tablet (25 mg total) by mouth every 8 (eight) hours as needed for nausea. 8 tablet 0  . ferrous sulfate 325 (65 FE) MG EC tablet Take 2 tablets (650 mg total) by mouth daily with breakfast. (Patient not taking: Reported on 04/22/2020) 60 tablet 3   No current facility-administered medications on file prior to visit.    ALLERGIES: No Known Allergies  FAMILY HISTORY: Family History  Problem Relation Age of Onset  . Migraines Neg Hx   . Seizures Neg Hx   . Depression Neg Hx   . Anxiety disorder Neg Hx   . Bipolar disorder Neg Hx   . Schizophrenia Neg Hx   . ADD / ADHD Neg Hx   . Autism Neg Hx     SOCIAL HISTORY: Social History   Socioeconomic History  . Marital status: Single    Spouse name: Not on file  . Number of children: Not on file  . Years of education: Not on file  . Highest education level: Not on file  Occupational History  . Not on file  Tobacco Use  . Smoking status: Never Smoker  . Smokeless tobacco: Never  Used  Vaping Use  . Vaping Use: Never used  Substance and Sexual Activity  . Alcohol use: No  . Drug use: No  . Sexual activity: Yes    Birth control/protection: Condom  Other Topics Concern  . Not on file  Social History Narrative   Grade:11th   School Name:[employer]   How does patient do in school: good   Patient lives with: father, mother and sister(s)   Does patient have and IEP/504 Plan in school? Yes   If so, is the patient meeting goals? Yes   Does patient receive therapies? No   What are the patient's hobbies or interest? Cheerleading, Singing, Dancing   Right handed   Social Determinants of Health   Financial Resource Strain: Not on file  Food Insecurity: Not on file  Transportation Needs: Not on file  Physical Activity: Not on file  Stress: Not on file  Social Connections: Not on file  Intimate Partner Violence: Not on file  PHYSICAL EXAM: Vitals:   04/22/20 1303  BP: 112/71  Pulse: 92  SpO2: 99%   General: No acute distress Head:  Normocephalic/atraumatic Skin/Extremities: No rash, no edema Neurological Exam: alert and awake. No aphasia or dysarthria. Fund of knowledge is appropriate. Attention and concentration are normal.   Cranial nerves: Pupils equal, round. Extraocular movements intact.  No facial asymmetry.  Motor: moves all extremities symmetrically. Gait narrow-based and steady.   IMPRESSION: This is a pleasant 20 yo RH woman with a history of iron deficiency anemia, cyclic vomiting, who had 3 seizures in one day in 2017, EEG normal at that time. She had recurrence of symptoms in 11/2019 and has been to the ER several times. Brain MRI normal. Her prolonged 68-hour EEG was normal, although typical events were not captured, we discussed video of her push button events and triggers that family as noticed (going to work), and discussed psychogenic non-epileptic events. We discussed diagnosis, prognosis, and management. No indication for  seizure medication, she will start weaning off Levetiracetam. Discussed treatment with cognitive behavioral therapy, she will be referred to [facility]. She is not driving. Follow-up in 6 months, call for any changes.    Thank you for allowing me to participate in her care.  Please do not hesitate to call for any questions or concerns.   Patrcia Dolly, M.D.   CC: Dr. Tor Netters

## 2020-04-22 NOTE — Patient Instructions (Signed)
1. Start weaning off Keppra: take 1/2 tablet twice a day for 1 week, then stop  2. 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 Follow-up in 6 months, call for any changes

## 2020-05-09 NOTE — Procedures (Signed)
ELECTROENCEPHALOGRAM REPORT  Dates of Recording: 04/15/2020 12:27PM to 04/18/2020 10:58AM Patient's Name: Meghan Benton MRN: [id number] Date of Birth: May 08, 2000  Referring Provider: Dr. Patrcia Dolly  Procedure: 68-hour ambulatory video EEG  History: This is a 20 year old woman with episodes of generalized stiffening/shaking, as well as episodes of trembling, eyes blinking uncontrollably with face also twitching, but she is awake and aware during them. EEG for characterization.  Medications: Keppra  Technical Summary: This is a 68-hour multichannel digital video EEG recording measured by the international 10-20 system with electrodes applied with paste and impedances below 5000 ohms performed as portable with EKG monitoring.  The digital EEG was referentially recorded, reformatted, and digitally filtered in a variety of bipolar and referential montages for optimal display.    DESCRIPTION OF RECORDING: During maximal wakefulness, the background activity consisted of a symmetric 9 Hz posterior dominant rhythm which was reactive to eye opening.  There were no epileptiform discharges or focal slowing seen in wakefulness.  During the recording, the patient progresses through wakefulness, drowsiness, and Stage 2 sleep.  Again, there were no epileptiform discharges seen.  Events: On 3/18 at 2308 hours, she reports hand shake/eye twitch. She is laying on the couch, appears tired, at times with eyes rolling upward, no clear motor activity seen.  Electrographically, there were no EEG or EKG changes seen.  On 3/20 at 0303 hours, she reports sinuses/heavy breathing. She is laying back on the couch, fanning herself, sits up appears tired answering family member. Electrographically, there were no EEG or EKG changes seen.  There were no electrographic seizures seen.  EKG lead was unremarkable.  IMPRESSION: This 68-hour ambulatory video EEG study is normal.    CLINICAL CORRELATION: A normal EEG does  not exclude a clinical diagnosis of epilepsy. Episodes of hand shake/eye twitch, heavy breathing did not show any epileptiform correlate. If further clinical questions remain, inpatient video EEG monitoring may be helpful.   Patrcia Dolly, M.D.

## 2020-06-21 ENCOUNTER — Ambulatory Visit (HOSPITAL_COMMUNITY): Payer: Self-pay | Admitting: Clinical

## 2020-06-28 ENCOUNTER — Ambulatory Visit (INDEPENDENT_AMBULATORY_CARE_PROVIDER_SITE_OTHER): Payer: BC Managed Care – PPO | Admitting: Clinical

## 2020-06-28 ENCOUNTER — Other Ambulatory Visit: Payer: Self-pay

## 2020-06-28 DIAGNOSIS — F4322 Adjustment disorder with anxiety: Secondary | ICD-10-CM
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
## 2020-07-19 ENCOUNTER — Ambulatory Visit (HOSPITAL_COMMUNITY): Payer: Medicaid Other | Admitting: Clinical

## 2020-09-06 ENCOUNTER — Telehealth: Payer: BC Managed Care – PPO | Admitting: Physician Assistant

## 2020-09-06 DIAGNOSIS — B9689 Other specified bacterial agents as the cause of diseases classified elsewhere: Secondary | ICD-10-CM

## 2020-09-06 DIAGNOSIS — J019 Acute sinusitis, unspecified: Secondary | ICD-10-CM | POA: Diagnosis not present

## 2020-09-06 MED ORDER — AMOXICILLIN-POT CLAVULANATE 875-125 MG PO TABS: 1.0000 | ORAL_TABLET | Freq: Two times a day (BID) | ORAL | 0 refills | Status: DC

## 2020-09-06 NOTE — Progress Notes (Signed)

## 2020-09-06 NOTE — Progress Notes (Signed)
I have spent 5 minutes in review of e-visit questionnaire, review and updating patient chart, medical decision making and response to patient.   Sharmane Dame Cody Rakel Junio, PA-C    

## 2020-09-07 ENCOUNTER — Telehealth: Payer: Self-pay | Admitting: *Deleted

## 2020-09-07 MED ORDER — AMOXICILLIN-POT CLAVULANATE 875-125 MG PO TABS
1.0000 | ORAL_TABLET | Freq: Two times a day (BID) | ORAL | 0 refills | Status: DC
Start: 2020-09-07 — End: 2020-10-18

## 2020-09-07 NOTE — Addendum Note (Signed)
Addended by: Freddy Finner on: 09/07/2020 09:06 AM   Modules accepted: Orders

## 2020-09-07 NOTE — Telephone Encounter (Signed)
Left patient a message with office information prior to her appointment on 10/20/2020.

## 2020-10-18 NOTE — Progress Notes (Deleted)
GYNECOLOGY ANNUAL PREVENTATIVE CARE ENCOUNTER NOTE  History:     Meghan Benton is a 20 y.o. No obstetric history on file. female here for a routine annual gynecologic exam.  Current complaints: ***.     Denies abnormal vaginal bleeding, discharge, pelvic pain, problems with intercourse or other gynecologic concerns.   She  had a normal pelvic US 11/2019. Done at that time for pelvic pain and heavy bleeding during periods.     Gynecologic History No LMP recorded. Contraception: {method:5051} Last Pap: Not yet had one  Obstetric History OB History  No obstetric history on file.    Past Medical History:  Diagnosis Date   Seizures (HCC)     Past Surgical History:  Procedure Laterality Date   BREAST CYST EXCISION      Current Outpatient Medications on File Prior to Visit  Medication Sig Dispense Refill   ferrous sulfate 325 (65 FE) MG EC tablet Take 2 tablets (650 mg total) by mouth daily with breakfast. (Patient not taking: Reported on 04/22/2020) 60 tablet 3   promethazine (PHENERGAN) 25 MG tablet Take 1 tablet (25 mg total) by mouth every 8 (eight) hours as needed for nausea. 8 tablet 0   No current facility-administered medications on file prior to visit.    No Known Allergies  Social History:  reports that she has never smoked. She has never used smokeless tobacco. She reports that she does not drink alcohol and does not use drugs.  Family History  Problem Relation Age of Onset   Migraines Neg Hx    Seizures Neg Hx    Depression Neg Hx    Anxiety disorder Neg Hx    Bipolar disorder Neg Hx    Schizophrenia Neg Hx    ADD / ADHD Neg Hx    Autism Neg Hx     The following portions of the patient's history were reviewed and updated as appropriate: allergies, current medications, past family history, past medical history, past social history, past surgical history and problem list.  Review of Systems Pertinent items noted in HPI and remainder of comprehensive  ROS otherwise negative.  Physical Exam:  There were no vitals taken for this visit. CONSTITUTIONAL: Well-developed, well-nourished female in no acute distress.  HENT:  Normocephalic, atraumatic, External right and left ear normal.  EYES: Conjunctivae and EOM are normal. Pupils are equal, round, and reactive to light. No scleral icterus.  NECK: Normal range of motion, supple, no masses.  Normal thyroid.  SKIN: Skin is warm and dry. No rash noted. Not diaphoretic. No erythema. No pallor. MUSCULOSKELETAL: Normal range of motion. No tenderness.  No cyanosis, clubbing, or edema. NEUROLOGIC: Alert and oriented to person, place, and time. Normal reflexes, muscle tone coordination.  PSYCHIATRIC: Normal mood and affect. Normal behavior. Normal judgment and thought content.  CARDIOVASCULAR: Normal heart rate noted, regular rhythm RESPIRATORY: Clear to auscultation bilaterally. Effort and breath sounds normal, no problems with respiration noted.  BREASTS: Symmetric in size. No masses, tenderness, skin changes, nipple drainage, or lymphadenopathy bilaterally. Performed in the presence of a chaperone. ABDOMEN: Soft, no distention noted.  No tenderness, rebound or guarding.  PELVIC: {PELVIC:22980}. Performed in the presence of a chaperone.    Assessment and Plan:    1. Encounter for annual routine gynecological examination - Cervical cancer screening: Discussed screening Q3 years. Reviewed importance of annual exams and limits of pap smear. Pap with reflex HPV done {Blank single:19197::"yes","no"} - GC/CT: Discussed and recommended. Pt  {Blank single:19197::"accepts","declines"} -  Gardasil: {Blank single:19197::"completed","has not yet had. Counseling provided and she declines","Has not yet had. Counseling provided and pt accepts"} - Birth Control: Discussed options and their risks, benefits and common side effects; discussed VTE with estrogen containing options. Desires: {Birth control type:23956} -  Breast Health: Encouraged self breast awareness/exams. Teaching provided. - Follow-up: 12 months and prn   2. Birth control counseling - Reviewed different types of birth control available: OCPs, vaginal ring, Nexplanon, Depo, various types of IUDs, permanent sterilization.  We reviewed the advantages and risks of each (particularly risk of VTE with estrogen containing options). We discussed side effects of each. - Reviewed that birth control does not protect against STI. Condoms reduce the risk of transmission but are not 100% effective especially for HSV and HIV. - Patient has tried: {Birth control type:23956} - Patient desires: {Birth control type:23956}      Routine preventative health maintenance measures emphasized. Please refer to After Visit Summary for other counseling recommendations.      Milas Hock, MD, FACOG Obstetrician & Gynecologist, [facility], [facility]

## 2020-10-20 ENCOUNTER — Encounter: Payer: BC Managed Care – PPO | Admitting: Obstetrics and Gynecology

## 2020-10-20 DIAGNOSIS — Z01419 Encounter for gynecological examination (general) (routine) without abnormal findings: Secondary | ICD-10-CM

## 2020-10-20 DIAGNOSIS — Z3009 Encounter for other general counseling and advice on contraception: Secondary | ICD-10-CM

## 2020-11-01 ENCOUNTER — Telehealth: Payer: BC Managed Care – PPO | Admitting: Physician Assistant

## 2020-11-01 DIAGNOSIS — A084 Viral intestinal infection, unspecified: Secondary | ICD-10-CM | POA: Diagnosis not present

## 2020-11-01 MED ORDER — ONDANSETRON HCL 4 MG PO TABS: 4.0000 mg | ORAL_TABLET | Freq: Three times a day (TID) | ORAL | 0 refills | Status: AC | PRN

## 2020-11-01 NOTE — Progress Notes (Signed)
Virtual Visit Consent   Meghan Benton, you are scheduled for a virtual visit with a [facility] [place] provider today.     Just as with appointments in the office, your consent must be obtained to participate.  Your consent will be active for this visit and any virtual visit you may have with one of our providers in the next 365 days.     If you have a MyChart account, a copy of this consent can be sent to you electronically.  All virtual visits are billed to your insurance company just like a traditional visit in the office.    As this is a virtual visit, video technology does not allow for your provider to perform a traditional examination.  This may limit your provider's ability to fully assess your condition.  If your provider identifies any concerns that need to be evaluated in person or the need to arrange testing (such as labs, EKG, etc.), we will make arrangements to do so.     Although advances in technology are sophisticated, we cannot ensure that it will always work on either your end or our end.  If the connection with a video visit is poor, the visit may have to be switched to a telephone visit.  With either a video or telephone visit, we are not always able to ensure that we have a secure connection.     I need to obtain your verbal consent now.   Are you willing to proceed with your visit today?    Meghan Benton has provided verbal consent on 11/01/2020 for a virtual visit (video or telephone).   Piedad Climes, [place]   Date: 11/01/2020 3:09 PM   Virtual Visit via Video Note   I, Piedad Climes, connected with  Meghan Benton  ([id number], 2000/06/22) on 11/01/20 at  3:00 PM EDT by a video-enabled telemedicine application and verified that I am speaking with the correct person using two identifiers.  Location: Patient: Virtual Visit Location Patient: Home Provider: Virtual Visit Location Provider: Home Office   I discussed the limitations of evaluation and management  by telemedicine and the availability of in person appointments. The patient expressed understanding and agreed to proceed.    History of Present Illness: Meghan Benton is a 20 y.o. who identifies as a female who was assigned female at birth, and is being seen today for GI symptoms starting within the past 48 hours. Endorses nausea with non-bloody emesis (only threw up on day 1 of symptoms). Notes continued loose stools. Denies melena, hematochezia or tenesmus. Abdominal cramping has resolved. Noted initially with some chills that have resolved. Is keeping well-hydrated.   HPI: HPI  Problems:  Patient Active Problem List   Diagnosis Date Noted   Syncope and collapse 02/15/2016    Allergies: No Known Allergies Medications:  Current Outpatient Medications:    ondansetron (ZOFRAN) 4 MG tablet, Take 1 tablet (4 mg total) by mouth every 8 (eight) hours as needed for nausea or vomiting., Disp: 20 tablet, Rfl: 0  Observations/Objective: Patient is well-developed, well-nourished in no acute distress.  Resting comfortably at home.  Head is normocephalic, atraumatic.  No labored breathing. Speech is clear and coherent with logical content.  Patient is alert and oriented at baseline.   Assessment and Plan: 1. Viral gastroenteritis - ondansetron (ZOFRAN) 4 MG tablet; Take 1 tablet (4 mg total) by mouth every 8 (eight) hours as needed for nausea or vomiting.  Dispense: 20 tablet; Refill: 0  Mi symptoms. Already improving. No vomiting in > 36 hours. Supportive measures and BRAT diet reviewed. Zofran placed on file at pharmacy in case of any recurrent nausea/vomiting. Work note written. Symptoms should continue to improve until resolved. Strict in person follow-up precautions reviewed with patient.   Follow Up Instructions: I discussed the assessment and treatment plan with the patient. The patient was provided an opportunity to ask questions and all were answered. The patient agreed with the plan and  demonstrated an understanding of the instructions.  A copy of instructions were sent to the patient via MyChart unless otherwise noted below.   The patient was advised to call back or seek an in-person evaluation if the symptoms worsen or if the condition fails to improve as anticipated.  Time:  I spent 12 minutes with the patient via telehealth technology discussing the above problems/concerns.    Piedad Climes, PA-C

## 2020-11-01 NOTE — Patient Instructions (Signed)
  Meghan Benton, thank you for joining Piedad Climes, PA-C for today's virtual visit.  While this provider is not your primary care provider (PCP), if your PCP is located in our provider database this encounter information will be shared with them immediately following your visit.  Consent: (Patient) Meghan Benton provided verbal consent for this virtual visit at the beginning of the encounter.  Current Medications:  Current Outpatient Medications:    ferrous sulfate 325 (65 FE) MG EC tablet, Take 2 tablets (650 mg total) by mouth daily with breakfast. (Patient not taking: Reported on 04/22/2020), Disp: 60 tablet, Rfl: 3   promethazine (PHENERGAN) 25 MG tablet, Take 1 tablet (25 mg total) by mouth every 8 (eight) hours as needed for nausea., Disp: 8 tablet, Rfl: 0   Medications ordered in this encounter:  No orders of the defined types were placed in this encounter.    *If you need refills on other medications prior to your next appointment, please contact your pharmacy*  Follow-Up: Call back or seek an in-person evaluation if the symptoms worsen or if the condition fails to improve as anticipated.  Other Instructions Please keep well-hydrated and get plenty of rest.  Start the dietary recommendations below. You can use OTC Immodium if needed. The Zofran has been sent to pharmacy in case of any recurring nausea/vomiting. Symptoms should continue to improve until fully resolved. If not or you note any new or worsening symptoms, you need an in-person evaluation.   If you have been instructed to have an in-person evaluation today at a local Urgent Care facility, please use the link below. It will take you to a list of all of our available [facility], including address, phone number and hours of operation. Please do not delay care.  [facility]  If you or a family member do not have a primary care provider, use the link below to schedule a visit and  establish care. When you choose a [facility] primary care physician or advanced practice provider, you gain a long-term partner in health. Find a Primary Care Provider  Learn more about [facility]'s in-office and virtual care options: [facility] - Get Care Now

## 2020-11-17 ENCOUNTER — Ambulatory Visit: Payer: BC Managed Care – PPO | Admitting: Neurology

## 2021-05-15 ENCOUNTER — Other Ambulatory Visit: Payer: Self-pay

## 2021-05-15 ENCOUNTER — Emergency Department (HOSPITAL_BASED_OUTPATIENT_CLINIC_OR_DEPARTMENT_OTHER)
Admission: EM | Admit: 2021-05-15 | Discharge: 2021-05-16 | Disposition: A | Discharge status: Home / Self Care | Payer: BC Managed Care – PPO | Attending: Emergency Medicine | Admitting: Emergency Medicine

## 2021-05-15 ENCOUNTER — Encounter (HOSPITAL_BASED_OUTPATIENT_CLINIC_OR_DEPARTMENT_OTHER): Payer: Self-pay | Admitting: *Deleted

## 2021-05-15 DIAGNOSIS — A599 Trichomoniasis, unspecified: Secondary | ICD-10-CM

## 2021-05-15 DIAGNOSIS — R55 Syncope and collapse: Secondary | ICD-10-CM | POA: Diagnosis present

## 2021-05-15 LAB — CBC
HCT: 36.2 % (ref 36.0–46.0)
Hemoglobin: 12.3 g/dL (ref 12.0–15.0)
MCH: 27.3 pg (ref 26.0–34.0)
MCHC: 34 g/dL (ref 30.0–36.0)
MCV: 80.4 fL (ref 80.0–100.0)
Platelets: 218 10*3/uL (ref 150–400)
RBC: 4.5 MIL/uL (ref 3.87–5.11)
RDW: 11.9 % (ref 11.5–15.5)
WBC: 9.4 10*3/uL (ref 4.0–10.5)
nRBC: 0 % (ref 0.0–0.2)

## 2021-05-15 LAB — URINALYSIS, MICROSCOPIC (REFLEX)

## 2021-05-15 LAB — URINALYSIS, ROUTINE W REFLEX MICROSCOPIC
Bilirubin Urine: NEGATIVE
Glucose, UA: NEGATIVE mg/dL
Ketones, ur: NEGATIVE mg/dL
Nitrite: NEGATIVE
Protein, ur: 100 mg/dL — AB
Specific Gravity, Urine: 1.01 (ref 1.005–1.030)
pH: 6 (ref 5.0–8.0)

## 2021-05-15 LAB — CBG MONITORING, ED: Glucose-Capillary: 97 mg/dL (ref 70–99)

## 2021-05-15 LAB — BASIC METABOLIC PANEL
Anion gap: 8 (ref 5–15)
BUN: 9 mg/dL (ref 6–20)
CO2: 26 mmol/L (ref 22–32)
Calcium: 8.8 mg/dL — ABNORMAL LOW (ref 8.9–10.3)
Chloride: 100 mmol/L (ref 98–111)
Creatinine, Ser: 0.88 mg/dL (ref 0.44–1.00)
GFR, Estimated: >60 mL/min (ref >60)
Glucose, Bld: 101 mg/dL — ABNORMAL HIGH (ref 70–99)
Potassium: 3.9 mmol/L (ref 3.5–5.1)
Sodium: 134 mmol/L — ABNORMAL LOW (ref 135–145)

## 2021-05-15 LAB — PREGNANCY, URINE: Preg Test, Ur: NEGATIVE

## 2021-05-15 NOTE — ED Triage Notes (Signed)
Parent reports pt was sitting on couch approx 1 hour pta when she called parents in to room. States pt became less responsive but didn't have any movements consistent with past seizures. Pt has had 3 such episodes in the last 2 days. Hx of seizures but has been off medication for at least a year ?

## 2021-05-16 LAB — RAPID URINE DRUG SCREEN, HOSP PERFORMED
Amphetamines: NOT DETECTED
Barbiturates: NOT DETECTED
Benzodiazepines: NOT DETECTED
Cocaine: NOT DETECTED
Opiates: NOT DETECTED
Tetrahydrocannabinol: NOT DETECTED

## 2021-05-16 MED ORDER — METRONIDAZOLE 500 MG PO TABS: 500.0000 mg | ORAL_TABLET | Freq: Two times a day (BID) | ORAL | 0 refills | Status: AC

## 2021-05-16 NOTE — ED Provider Notes (Signed)
?[facility] EMERGENCY DEPARTMENT ?Provider Note ? ? ?CSN: [id number] ?Arrival date & time: 05/15/21  2210 ? ?  ? ?History ? ?Chief Complaint  ?Patient presents with  ? Loss of Consciousness  ? ? ?Meghan Benton is a 21 y.o. female. ? ?The history is provided by the patient and a parent.  ?Loss of Consciousness ?Episode history:  Multiple ?Progression:  Improving ?Chronicity:  New ?Witnessed: yes   ?Worsened by:  Nothing ?Associated symptoms: anxiety   ?Associated symptoms: no chest pain, no difficulty breathing, no fever, no headaches, no palpitations, no seizures, no shortness of breath and no vomiting   ?Risk factors: seizures   ?Risk factors: no coronary artery disease   ?Patient with previous history of nonepileptic seizures presents with syncope.  Patient had been in the shower earlier today and felt lightheaded and nearly had a syncopal episode.  Several hours later the patient had a brief syncopal episode that lasted less than a minute that was witnessed by parents. ?Patient had a similar episode several days ago and was seen in outside ER ?Patient denies previous history of exertional syncope.  No family history of sudden cardiac death ?No new medications. ?No seizure activity was witnessed by family ?Patient is no longer taking antiepileptic drugs ?No injuries are reported from the syncopal episode ?Home Medications ?Prior to Admission medications   ?Medication Sig Start Date End Date Taking? Authorizing Provider  ?metroNIDAZOLE (FLAGYL) 500 MG tablet Take 1 tablet (500 mg total) by mouth 2 (two) times daily. 05/16/21  Yes Zadie Rhine, MD  ?ondansetron (ZOFRAN) 4 MG tablet Take 1 tablet (4 mg total) by mouth every 8 (eight) hours as needed for nausea or vomiting. 11/01/20   Waldon Merl, PA-C  ?   ? ?Allergies    ?Patient has no known allergies.   ? ?Review of Systems   ?Review of Systems  ?Constitutional:  Positive for fatigue. Negative for fever.  ?HENT:  Positive for ear pain.    ?Respiratory:  Negative for shortness of breath.   ?Cardiovascular:  Positive for syncope. Negative for chest pain and palpitations.  ?Gastrointestinal:  Negative for vomiting.  ?Genitourinary:  Positive for vaginal bleeding.  ?     Currently on menstrual cycle  ?Neurological:  Positive for syncope. Negative for seizures and headaches.  ?All other systems reviewed and are negative. ? ?Physical Exam ?Updated Vital Signs ?BP 108/80   Pulse 71   Temp 98.2 ?F (36.8 ?C) (Oral)   Resp 19   Ht 1.702 m (5\' 7" )   Wt 72.6 kg   LMP 05/02/2021   SpO2 99%   BMI 25.06 kg/m?  ?Physical Exam ?CONSTITUTIONAL: Well developed/well nourished ?HEAD: Normocephalic/atraumatic ?EYES: EOMI/PERRL, no nystagmus, no ptosis ?ENMT: Mucous membranes moist, TMs clear and intact ?NECK: supple no meningeal signs ?CV: S1/S2 noted, no murmurs/rubs/gallops noted ?LUNGS: Lungs are clear to auscultation bilaterally, no apparent distress ?ABDOMEN: soft, nontender, no rebound or guarding ?GU:no cva tenderness ?NEURO:Awake/alert, face symmetric, no arm or leg drift is noted ?Equal 5/5 strength with shoulder abduction, elbow flex/extension, wrist flex/extension in upper extremities and equal hand grips bilaterally ?Equal 5/5 strength with hip flexion,knee flex/extension, foot dorsi/plantar flexion ?Cranial nerves 3/4/5/6/08/06/08/11/12 tested and intact ?Sensation to light touch intact in all extremities ?EXTREMITIES: pulses normal, full ROM ?SKIN: warm, color normal ?PSYCH: no abnormalities of mood noted ? ?ED Results / Procedures / Treatments   ?Labs ?(all labs ordered are listed, but only abnormal results are displayed) ?Labs Reviewed  ?BASIC METABOLIC  PANEL - Abnormal; Notable for the following components:  ?    Result Value  ? Sodium 134 (*)   ? Glucose, Bld 101 (*)   ? Calcium 8.8 (*)   ? All other components within normal limits  ?URINALYSIS, ROUTINE W REFLEX MICROSCOPIC - Abnormal; Notable for the following components:  ? Color, Urine RED (*)    ? APPearance CLOUDY (*)   ? Hgb urine dipstick LARGE (*)   ? Protein, ur 100 (*)   ? Leukocytes,Ua TRACE (*)   ? All other components within normal limits  ?URINALYSIS, MICROSCOPIC (REFLEX) - Abnormal; Notable for the following components:  ? Bacteria, UA RARE (*)   ? Trichomonas, UA PRESENT (*)   ? All other components within normal limits  ?CBC  ?PREGNANCY, URINE  ?RAPID URINE DRUG SCREEN, HOSP PERFORMED  ?CBG MONITORING, ED  ? ? ?EKG ?EKG Interpretation ? ?Date/Time:  Monday May 15 2021 22:51:21 EDT ?Ventricular Rate:  92 ?PR Interval:  142 ?QRS Duration: 74 ?QT Interval:  322 ?QTC Calculation: 398 ?R Axis:   43 ?Text Interpretation: Sinus rhythm with Premature supraventricular complexes Otherwise normal ECG When compared with ECG of 30-Nov-2019 11:08, No significant change since last tracing Confirmed by Zadie RhineWickline, Benny Deutschman ([id number]) on 05/15/2021 11:10:00 PM ? ?Radiology ?No results found. ? ?Procedures ?Procedures  ? ? ?Medications Ordered in ED ?Medications - No data to display ? ?ED Course/ Medical Decision Making/ A&P ?Clinical Course as of 05/16/21 0242  ?Tue May 16, 2021  ?0154 Pt stable.  No changes on telemetry.  She was informed of Trich diagnosis and need to start flagyl.  Pt gave permission to tell her mother.   [DW]  ?  ?Clinical Course User Index ?[DW] Zadie RhineWickline, Jerimyah Vandunk, MD  ? ?                        ?Medical Decision Making ?Amount and/or Complexity of Data Reviewed ?Labs: ordered. ? ?Risk ?Prescription drug management. ? ? ?This patient presents to the ED for concern of syncope, this involves an extensive number of treatment options, and is a complaint that carries with it a high risk of complications and morbidity.  The differential diagnosis includes but is not limited to vasovagal syncope, orthostatic hypotension, seizure, cardiac arrhythmia, AV block ? ?Comorbidities that complicate the patient evaluation: ?Patient?s presentation is complicated by their history of nonepileptic  seizures ? ?Additional history obtained: ?Additional history obtained from family ?Records reviewed Care Everywhere/External Records ?Neurology notes reviewed ? ?Lab Tests: ?I Ordered, and personally interpreted labs.  The pertinent results include: Urine likely contaminant from menstrual cycle ? ?Imaging Studies ordered: ?Indication for imaging, patient's had previous neuroimaging ? ?Cardiac Monitoring: ?The patient was maintained on a cardiac monitor.  I personally viewed and interpreted the cardiac monitor which showed an underlying rhythm of:  sinus rhythm ? ? ?Test Considered: ?Patient is low risk for high risk because of syncope ? ? ? ?Consultations Obtained: ?We will give cardiology referral for outpatient cardiac monitor ? ?Reevaluation: ?After the interventions noted above, I reevaluated the patient and found that they have :improved ? ?Complexity of problems addressed: ?Patient?s presentation is most consistent with  acute presentation with potential threat to life or bodily function ? ?Disposition: ?After consideration of the diagnostic results and the patient?s response to treatment,  ?I feel that the patent would benefit from discharge   .  ? ?Patient is low risk for serious cause of syncope.  She is in  no distress.  No findings on telemetry monitoring.  Referral to cardiology is been given ? ? ? ? ? ? ? ?Final Clinical Impression(s) / ED Diagnoses ?Final diagnoses:  ?Syncope and collapse  ?Trichomonal infection  ? ? ?Rx / DC Orders ?ED Discharge Orders   ? ?      Ordered  ?  Ambulatory referral to Cardiology       ?Comments: Syncope, needs monitoring  ? 05/16/21 0027  ?  metroNIDAZOLE (FLAGYL) 500 MG tablet  2 times daily       ? 05/16/21 0144  ? ?  ?  ? ?  ? ? ?  ?Zadie Rhine, MD ?05/16/21 [phone] ? ?

## 2021-10-24 ENCOUNTER — Encounter (HOSPITAL_BASED_OUTPATIENT_CLINIC_OR_DEPARTMENT_OTHER): Payer: Self-pay | Admitting: Emergency Medicine

## 2021-10-24 ENCOUNTER — Emergency Department (HOSPITAL_BASED_OUTPATIENT_CLINIC_OR_DEPARTMENT_OTHER)
Admission: EM | Admit: 2021-10-24 | Discharge: 2021-10-24 | Disposition: A | Discharge status: Home / Self Care | Payer: BC Managed Care – PPO | Attending: Emergency Medicine | Admitting: Emergency Medicine

## 2021-10-24 ENCOUNTER — Other Ambulatory Visit: Payer: Self-pay

## 2021-10-24 DIAGNOSIS — D72829 Elevated white blood cell count, unspecified: Secondary | ICD-10-CM | POA: Insufficient documentation

## 2021-10-24 DIAGNOSIS — R1013 Epigastric pain: Secondary | ICD-10-CM | POA: Diagnosis present

## 2021-10-24 DIAGNOSIS — N390 Urinary tract infection, site not specified: Secondary | ICD-10-CM | POA: Diagnosis not present

## 2021-10-24 DIAGNOSIS — Z20822 Contact with and (suspected) exposure to covid-19: Secondary | ICD-10-CM | POA: Insufficient documentation

## 2021-10-24 DIAGNOSIS — R112 Nausea with vomiting, unspecified: Secondary | ICD-10-CM

## 2021-10-24 DIAGNOSIS — R109 Unspecified abdominal pain: Secondary | ICD-10-CM

## 2021-10-24 LAB — COMPREHENSIVE METABOLIC PANEL
ALT: 11 U/L (ref 0–44)
AST: 20 U/L (ref 15–41)
Albumin: 3.7 g/dL (ref 3.5–5.0)
Alkaline Phosphatase: 57 U/L (ref 38–126)
Anion gap: 9 (ref 5–15)
BUN: 8 mg/dL (ref 6–20)
CO2: 21 mmol/L — ABNORMAL LOW (ref 22–32)
Calcium: 8.6 mg/dL — ABNORMAL LOW (ref 8.9–10.3)
Chloride: 107 mmol/L (ref 98–111)
Creatinine, Ser: 0.85 mg/dL (ref 0.44–1.00)
GFR, Estimated: >60 mL/min (ref >60)
Glucose, Bld: 124 mg/dL — ABNORMAL HIGH (ref 70–99)
Potassium: 3.5 mmol/L (ref 3.5–5.1)
Sodium: 137 mmol/L (ref 135–145)
Total Bilirubin: 1 mg/dL (ref 0.3–1.2)
Total Protein: 7.3 g/dL (ref 6.5–8.1)

## 2021-10-24 LAB — CBC WITH DIFFERENTIAL/PLATELET
Abs Immature Granulocytes: 0.03 10*3/uL (ref 0.00–0.07)
Basophils Absolute: 0 10*3/uL (ref 0.0–0.1)
Basophils Relative: 0 %
Eosinophils Absolute: 0 10*3/uL (ref 0.0–0.5)
Eosinophils Relative: 0 %
HCT: 35.1 % — ABNORMAL LOW (ref 36.0–46.0)
Hemoglobin: 11.9 g/dL — ABNORMAL LOW (ref 12.0–15.0)
Immature Granulocytes: 0 %
Lymphocytes Relative: 6 %
Lymphs Abs: 0.8 10*3/uL (ref 0.7–4.0)
MCH: 26.9 pg (ref 26.0–34.0)
MCHC: 33.9 g/dL (ref 30.0–36.0)
MCV: 79.4 fL — ABNORMAL LOW (ref 80.0–100.0)
Monocytes Absolute: 0.5 10*3/uL (ref 0.1–1.0)
Monocytes Relative: 4 %
Neutro Abs: 10.9 10*3/uL — ABNORMAL HIGH (ref 1.7–7.7)
Neutrophils Relative %: 90 %
Platelets: 254 10*3/uL (ref 150–400)
RBC: 4.42 MIL/uL (ref 3.87–5.11)
RDW: 11.7 % (ref 11.5–15.5)
WBC: 12.4 10*3/uL — ABNORMAL HIGH (ref 4.0–10.5)
nRBC: 0 % (ref 0.0–0.2)

## 2021-10-24 LAB — RAPID URINE DRUG SCREEN, HOSP PERFORMED
Amphetamines: NOT DETECTED
Barbiturates: NOT DETECTED
Benzodiazepines: NOT DETECTED
Cocaine: NOT DETECTED
Opiates: NOT DETECTED
Tetrahydrocannabinol: NOT DETECTED

## 2021-10-24 LAB — RESP PANEL BY RT-PCR (FLU A&B, COVID) ARPGX2
Influenza A by PCR: NEGATIVE
Influenza B by PCR: NEGATIVE
SARS Coronavirus 2 by RT PCR: NEGATIVE

## 2021-10-24 LAB — URINALYSIS, ROUTINE W REFLEX MICROSCOPIC
Bilirubin Urine: NEGATIVE
Glucose, UA: NEGATIVE mg/dL
Ketones, ur: NEGATIVE mg/dL
Nitrite: NEGATIVE
Protein, ur: 100 mg/dL — AB
Specific Gravity, Urine: 1.02 (ref 1.005–1.030)
pH: >=9 (ref 5.0–8.0)

## 2021-10-24 LAB — MAGNESIUM: Magnesium: 1.7 mg/dL (ref 1.7–2.4)

## 2021-10-24 LAB — URINALYSIS, MICROSCOPIC (REFLEX)

## 2021-10-24 LAB — PREGNANCY, URINE: Preg Test, Ur: NEGATIVE

## 2021-10-24 LAB — CK: Total CK: 70 U/L (ref 38–234)

## 2021-10-24 LAB — LIPASE, BLOOD: Lipase: 26 U/L (ref 11–51)

## 2021-10-24 MED ORDER — ALUM & MAG HYDROXIDE-SIMETH 200-200-20 MG/5ML PO SUSP
30.0000 mL | Freq: Once | ORAL | Status: DC
  Filled 2021-10-24: qty 30

## 2021-10-24 MED ORDER — SUCRALFATE 1 G PO TABS: 1.0000 g | ORAL_TABLET | Freq: Three times a day (TID) | ORAL | 0 refills | Status: AC

## 2021-10-24 MED ORDER — SODIUM CHLORIDE 0.9 % IV BOLUS
1000.0000 mL | Freq: Once | INTRAVENOUS | Status: AC
  Administered 2021-10-24: 1000 mL via INTRAVENOUS

## 2021-10-24 MED ORDER — FAMOTIDINE IN NACL 20-0.9 MG/50ML-% IV SOLN
20.0000 mg | Freq: Once | INTRAVENOUS | Status: AC
  Administered 2021-10-24: 20 mg via INTRAVENOUS
  Filled 2021-10-24: qty 50

## 2021-10-24 MED ORDER — LIDOCAINE VISCOUS HCL 2 % MT SOLN
15.0000 mL | Freq: Once | OROMUCOSAL | Status: DC
  Filled 2021-10-24: qty 15

## 2021-10-24 MED ORDER — ONDANSETRON HCL 4 MG/2ML IJ SOLN
4.0000 mg | Freq: Once | INTRAMUSCULAR | Status: AC
  Administered 2021-10-24: 4 mg via INTRAVENOUS
  Filled 2021-10-24: qty 2

## 2021-10-24 MED ORDER — CEPHALEXIN 500 MG PO CAPS: 500.0000 mg | ORAL_CAPSULE | Freq: Four times a day (QID) | ORAL | 0 refills | Status: AC

## 2021-10-24 MED ORDER — ONDANSETRON HCL 4 MG PO TABS: 4.0000 mg | ORAL_TABLET | Freq: Three times a day (TID) | ORAL | 0 refills | Status: AC | PRN

## 2021-10-24 NOTE — Discharge Instructions (Addendum)
It was a pleasure caring for you today in the emergency department. ° °Please return to the emergency department for any worsening or worrisome symptoms. ° ° °

## 2021-10-24 NOTE — ED Provider Notes (Signed)
[facility] EMERGENCY DEPARTMENT Provider Note   CSN: [id number] Arrival date & time: 10/24/21  0052     History  Chief Complaint  Patient presents with   Emesis   Seizures    Meghan Benton is a 21 y.o. female.  Patient as above with significant medical history as below, including non-epileptic seizures who presents to the ED with complaint of emesis/abd pain/seizure.  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 Keppra 1000mg  bid as of 3/22, advised to wean off by [facility]BN.  She is now totally d/c the Keppra.  Advised to f/u with [facility] / cognitive bh for non-epileptic sz  Patient with nausea, vomiting abdominal cramping since around 9 PM this evening.  Last p.o. intake was around lunchtime, salad from [employer].  No sick contacts recent travel.  People that consumed lunch with her are not ill.  No fevers or chills past 24 hours.  No diarrhea.  She has abdominal cramping is epigastric, right upper quadrant.  Mother reports patient has similar symptoms when she is menstruating, she recently completed her menstrual cycle.  Having some dysuria, urgency, frequency over the past day or 2.  No abnormal vaginal bleeding or discharge.  Did recently finished her menstrual cycle.  Menses were typical  Mother reports she had a few episodes of her typical seizure-like activity at home prior to arrival consisted of eyes rolling to the back of her head and head bobbing sensation.  Does not appear to be postictal.  Following the seizure like activity.  Mother reports these are typical of her prior seizures.  No incontinence.  Mother reports that she is essentially back to her baseline at this time.      Past Medical History:  Diagnosis Date   Seizures [facility])     Past Surgical History:  Procedure Laterality Date   BREAST CYST EXCISION       The history is provided by the patient and a parent. No language interpreter was used.  Emesis Associated symptoms: abdominal pain    Associated symptoms: no cough, no fever and no headaches   Seizures      Home Medications Prior to Admission medications   Medication Sig Start Date End Date Taking? Authorizing Provider  cephALEXin (KEFLEX) 500 MG capsule Take 1 capsule (500 mg total) by mouth 4 (four) times daily for 12 days. 10/24/21 11/05/21 Yes 01/05/22 A, DO  ondansetron (ZOFRAN) 4 MG tablet Take 1 tablet (4 mg total) by mouth every 8 (eight) hours as needed for nausea or vomiting. 10/24/21  Yes 10/26/21 A, DO  sucralfate (CARAFATE) 1 g tablet Take 1 tablet (1 g total) by mouth 4 (four) times daily -  with meals and at bedtime for 7 days. 10/24/21 10/31/21 Yes 12/31/21 A, DO  metroNIDAZOLE (FLAGYL) 500 MG tablet Take 1 tablet (500 mg total) by mouth 2 (two) times daily. 05/16/21   05/18/21, MD  ondansetron (ZOFRAN) 4 MG tablet Take 1 tablet (4 mg total) by mouth every 8 (eight) hours as needed for nausea or vomiting. 11/01/20   01/01/21, PA-C      Allergies    Patient has no known allergies.    Review of Systems   Review of Systems  Constitutional:  Positive for appetite change. Negative for activity change and fever.  HENT:  Negative for facial swelling and trouble swallowing.   Eyes:  Negative for discharge and redness.  Respiratory:  Negative for cough and shortness  of breath.   Cardiovascular:  Negative for chest pain and palpitations.  Gastrointestinal:  Positive for abdominal pain, nausea and vomiting.  Genitourinary:  Positive for dysuria and urgency. Negative for flank pain.  Musculoskeletal:  Negative for back pain and gait problem.  Skin:  Negative for pallor and rash.  Neurological:  Positive for seizures. Negative for syncope and headaches.    Physical Exam Updated Vital Signs BP (!) 141/99   Pulse 94   Temp 97.9 F (36.6 C) (Oral)   Resp (!) 25   Wt 71.1 kg   SpO2 100%   BMI 24.56 kg/m  Physical Exam Vitals and nursing note reviewed.  Constitutional:       General: She is not in acute distress.    Appearance: Normal appearance. She is not ill-appearing or diaphoretic.  HENT:     Head: Normocephalic and atraumatic. No Battle's sign, right periorbital erythema or left periorbital erythema.     Jaw: There is normal jaw occlusion.     Right Ear: External ear normal.     Left Ear: External ear normal.     Nose: Nose normal.     Mouth/Throat:     Mouth: Mucous membranes are moist.  Eyes:     General: No scleral icterus.       Right eye: No discharge.        Left eye: No discharge.     Extraocular Movements: Extraocular movements intact.     Pupils: Pupils are equal, round, and reactive to light.  Cardiovascular:     Rate and Rhythm: Normal rate and regular rhythm.     Pulses: Normal pulses.     Heart sounds: Normal heart sounds.  Pulmonary:     Effort: Pulmonary effort is normal. No respiratory distress.     Breath sounds: Normal breath sounds.  Abdominal:     General: Abdomen is flat.     Palpations: Abdomen is soft.     Tenderness: There is abdominal tenderness. There is no right CVA tenderness, left CVA tenderness, guarding or rebound.    Musculoskeletal:        General: Normal range of motion.     Cervical back: Normal range of motion.     Right lower leg: No edema.     Left lower leg: No edema.  Skin:    General: Skin is warm and dry.     Capillary Refill: Capillary refill takes less than 2 seconds.  Neurological:     Mental Status: She is alert and oriented to person, place, and time. Mental status is at baseline.     GCS: GCS eye subscore is 4. GCS verbal subscore is 5. GCS motor subscore is 6.     Cranial Nerves: Cranial nerves 2-12 are intact. No dysarthria or facial asymmetry.     Sensory: Sensation is intact.     Motor: Motor function is intact.     Coordination: Coordination is intact.  Psychiatric:        Mood and Affect: Mood normal.        Behavior: Behavior normal.     ED Results / Procedures / Treatments    Labs (all labs ordered are listed, but only abnormal results are displayed) Labs Reviewed  CBC WITH DIFFERENTIAL/PLATELET - Abnormal; Notable for the following components:      Result Value   WBC 12.4 (*)    Hemoglobin 11.9 (*)    HCT 35.1 (*)    MCV 79.4 (*)  Neutro Abs 10.9 (*)    All other components within normal limits  COMPREHENSIVE METABOLIC PANEL - Abnormal; Notable for the following components:   CO2 21 (*)    Glucose, Bld 124 (*)    Calcium 8.6 (*)    All other components within normal limits  URINALYSIS, ROUTINE W REFLEX MICROSCOPIC - Abnormal; Notable for the following components:   APPearance CLOUDY (*)    Hgb urine dipstick TRACE (*)    Protein, ur 100 (*)    Leukocytes,Ua SMALL (*)    All other components within normal limits  URINALYSIS, MICROSCOPIC (REFLEX) - Abnormal; Notable for the following components:   Bacteria, UA RARE (*)    All other components within normal limits  RESP PANEL BY RT-PCR (FLU A&B, COVID) ARPGX2  URINE CULTURE  LIPASE, BLOOD  PREGNANCY, URINE  MAGNESIUM  RAPID URINE DRUG SCREEN, HOSP PERFORMED  CK    EKG None  Radiology No results found.  Procedures Procedures    Medications Ordered in ED Medications  alum & mag hydroxide-simeth (MAALOX/MYLANTA) 200-200-20 MG/5ML suspension 30 mL (has no administration in time range)    And  lidocaine (XYLOCAINE) 2 % viscous mouth solution 15 mL (has no administration in time range)  sodium chloride 0.9 % bolus 1,000 mL (1,000 mLs Intravenous New Bag/Given 10/24/21 0207)  ondansetron (ZOFRAN) injection 4 mg (4 mg Intravenous Given 10/24/21 0205)  famotidine (PEPCID) IVPB 20 mg premix (20 mg Intravenous New Bag/Given 10/24/21 0208)    ED Course/ Medical Decision Making/ A&P                           Medical Decision Making Amount and/or Complexity of Data Reviewed Labs: ordered. ECG/medicine tests: ordered.  Risk OTC drugs. Prescription drug management.   This patient presents  to the ED with chief complaint(s) of nausea, vomiting abdominal pain, seizure with pertinent past medical history of nonepileptic seizures which further complicates the presenting complaint. The complaint involves an extensive differential diagnosis and also carries with it a high risk of complications and morbidity.    Differential diagnosis includes but is not exclusive to acute cholecystitis, intrathoracic causes for epigastric abdominal pain, gastritis, duodenitis, pancreatitis, small bowel or large bowel obstruction, abdominal aortic aneurysm, hernia, gastritis, THC induced, cyclical vomiting, nonepileptic seizures, psychogenic etc.  . Serious etiologies were considered.   The initial plan is to screening labs, IV fluids, GI cocktail, antiemetics   Additional history obtained: Additional history obtained from family Records reviewed Care Everywhere/External Records, Primary Care Documents, and home medications, prior ED visits  Independent labs interpretation:  The following labs were independently interpreted:  Urinalysis with rare bacteria, 11-20 WBCs, small leuk esterase, negative nitrates; having dysuria, urgency, frequency.  We will send urine culture. >> CVA tenderness, less likely pyelonephritis CBC with mild WBC elevation 12.4, favor reactive in setting of vomiting- Creatinine stable  Independent visualization of imaging:  Pulse oximetry with o2 sat 99-100% on RA  Treatment and Reassessment: GI cocktail, IV fluids >> symptoms significantly improved.  She is able to tolerate p.o. intake without difficulty.  Discomfort has resolved  Consultation: - Consulted or discussed management/test interpretation w/ external professional: na  Consideration for admission or further workup: Admission was considered   Well-appearing 21 year old female to the ED for concern of possible seizure, nausea and vomiting.  Physical exam is reassuring.  No external evidence of trauma.  She is  acting her baseline per mother at bedside.  Epigastric  discomfort, abdomen is not peritoneal.  HDS.  Labs reviewed and are stable, she has questionable UTI.  Is having dysuria, UTI symptoms.  Will send culture, start Keflex for home.  She has no CVA tenderness, no fevers, she is not septic in appearance.  I have low suspicion for pyelonephritis.  No flank pain.  Low suspicion for nephropathy.  Creatinine stable.  Hemoglobin stable.   Patient did refuse Maalox, Mylanta but she was agreeable for the famotidine and the Zofran.  Symptoms essentially resolved.  She is able to tolerate p.o. intake.  Cramping has resolved.  She is HDS.  We will start oral antibiotics for UTI.  We will give Carafate and Zofran for home.   Strict return precautions were discussed.  advised to follow-up with PCP.  The patient improved significantly and was discharged in stable condition. Detailed discussions were had with the patient regarding current findings, and need for close f/u with PCP or on call doctor. The patient has been instructed to return immediately if the symptoms worsen in any way for re-evaluation. Patient verbalized understanding and is in agreement with current care plan. All questions answered prior to discharge.    Social Determinants of health: Social History   Tobacco Use   Smoking status: Never   Smokeless tobacco: Never  Vaping Use   Vaping Use: Never used  Substance Use Topics   Alcohol use: Yes    Comment: weekends   Drug use: Yes    Types: Marijuana            Final Clinical Impression(s) / ED Diagnoses Final diagnoses:  Nausea and vomiting, unspecified vomiting type  Abdominal pain, unspecified abdominal location  Urinary tract infection without hematuria, site unspecified    Rx / DC Orders ED Discharge Orders          Ordered    sucralfate (CARAFATE) 1 g tablet  3 times daily with meals & bedtime        10/24/21 0447    ondansetron (ZOFRAN) 4 MG tablet  Every 8  hours PRN        10/24/21 0447    cephALEXin (KEFLEX) 500 MG capsule  4 times daily        10/24/21 0501              Sloan Leiter, DO 10/24/21 0507

## 2021-10-24 NOTE — ED Triage Notes (Signed)
Patient arrived via EMS c/o seizure like activity with emesis. Patient has hx of absence seizures. Per EMS patient had multiple episodes of emesis today. Patient is Alert, VS WDL.

## 2021-10-26 LAB — URINE CULTURE: Culture: >=100000 — AB

## 2021-10-27 ENCOUNTER — Telehealth (HOSPITAL_BASED_OUTPATIENT_CLINIC_OR_DEPARTMENT_OTHER): Payer: Self-pay

## 2021-10-27 NOTE — Progress Notes (Signed)
ED Antimicrobial Stewardship Positive Culture Follow Up   Meghan Benton is an 21 y.o. female who presented to [facility] on 10/24/2021 with a chief complaint of  Chief Complaint  Patient presents with   Emesis   Seizures    Recent Results (from the past 720 hour(s))  Resp Panel by RT-PCR (Flu A&B, Covid) Anterior Nasal Swab     Status: None   Collection Time: 10/24/21  2:13 AM   Specimen: Anterior Nasal Swab  Result Value Ref Range Status   SARS Coronavirus 2 by RT PCR NEGATIVE NEGATIVE Final    Comment: (NOTE) SARS-CoV-2 target nucleic acids are NOT DETECTED.  The SARS-CoV-2 RNA is generally detectable in upper respiratory specimens during the acute phase of infection. The lowest concentration of SARS-CoV-2 viral copies this assay can detect is 138 copies/mL. A negative result does not preclude SARS-Cov-2 infection and should not be used as the sole basis for treatment or other patient management decisions. A negative result may occur with  improper specimen collection/handling, submission of specimen other than nasopharyngeal swab, presence of viral mutation(s) within the areas targeted by this assay, and inadequate number of viral copies(<138 copies/mL). A negative result must be combined with clinical observations, patient history, and epidemiological information. The expected result is Negative.  Fact Sheet for Patients:  BloggerCourse.com  Fact Sheet for Healthcare Providers:  SeriousBroker.it  This test is no t yet approved or cleared by the [place] FDA and  has been authorized for detection and/or diagnosis of SARS-CoV-2 by FDA under an Emergency Use Authorization (EUA). This EUA will remain  in effect (meaning this test can be used) for the duration of the COVID-19 declaration under Section 564(b)(1) of the Act, 21 U.S.C.section 360bbb-3(b)(1), unless the authorization is terminated  or revoked sooner.        Influenza A by PCR NEGATIVE NEGATIVE Final   Influenza B by PCR NEGATIVE NEGATIVE Final    Comment: (NOTE) The Xpert Xpress SARS-CoV-2/FLU/RSV plus assay is intended as an aid in the diagnosis of influenza from Nasopharyngeal swab specimens and should not be used as a sole basis for treatment. Nasal washings and aspirates are unacceptable for Xpert Xpress SARS-CoV-2/FLU/RSV testing.  Fact Sheet for Patients: BloggerCourse.com  Fact Sheet for Healthcare Providers: SeriousBroker.it  This test is not yet approved or cleared by the [place] FDA and has been authorized for detection and/or diagnosis of SARS-CoV-2 by FDA under an Emergency Use Authorization (EUA). This EUA will remain in effect (meaning this test can be used) for the duration of the COVID-19 declaration under Section 564(b)(1) of the Act, 21 U.S.C. section 360bbb-3(b)(1), unless the authorization is terminated or revoked.  Performed at [facility], [address], [place], [place] [address]   Urine Culture     Status: Abnormal   Collection Time: 10/24/21  5:20 AM   Specimen: Urine, Clean Catch  Result Value Ref Range Status   Specimen Description   Final    URINE, CLEAN CATCH Performed at [facility], [address] [place], [place] [address]    Special Requests   Final    NONE Performed at [facility], [address] [place], [place] [address]    Culture >=100,000 COLONIES/mL STAPHYLOCOCCUS SAPROPHYTICUS (A)  Final   Report Status 10/26/2021 FINAL  Final   Organism ID, Bacteria STAPHYLOCOCCUS SAPROPHYTICUS (A)  Final      Susceptibility   Staphylococcus saprophyticus - MIC*  CIPROFLOXACIN <=0.5 SENSITIVE Sensitive     GENTAMICIN <=0.5 SENSITIVE Sensitive     NITROFURANTOIN <=16 SENSITIVE Sensitive     OXACILLIN 0.5 RESISTANT Resistant     TETRACYCLINE <=1 SENSITIVE Sensitive     VANCOMYCIN <=0.5 SENSITIVE  Sensitive     TRIMETH/SULFA <=10 SENSITIVE Sensitive     CLINDAMYCIN <=0.25 SENSITIVE Sensitive     RIFAMPIN <=0.5 SENSITIVE Sensitive     Inducible Clindamycin NEGATIVE Sensitive     * >=100,000 COLONIES/mL STAPHYLOCOCCUS SAPROPHYTICUS    [x]  Treated with Keflex, organism resistant to prescribed antimicrobial []  Patient discharged originally without antimicrobial agent and treatment is now indicated  New antibiotic prescription: [place]  ED Provider: Domenic Moras, PA-C   Meghan Benton 10/27/2021, 8:13 AM Clinical Pharmacist Monday - Friday phone -  [phone] Saturday - Sunday phone - [phone]

## 2021-10-27 NOTE — Telephone Encounter (Signed)
Post ED Visit - Positive Culture Follow-up: Unsuccessful Patient Follow-up  Culture assessed and recommendations reviewed by:  [x]  Radene Journey, Pharm.D. []  Heide Guile, Pharm.D., BCPS AQ-ID []  Parks Neptune, Pharm.D., BCPS []  Alycia Rossetti, Pharm.D., BCPS []  [place], [place].D., BCPS, AAHIVP []  Legrand Como, Pharm.D., BCPS, AAHIVP []  Wynell Balloon, PharmD []  Vincenza Hews, PharmD, BCPS  Positive urine culture   Plan: Stop Keflex and start Macrobid 100 mg po BID x 5 days per ED provider Domenic Moras, PA-C   []  Patient discharged without antimicrobial prescription and treatment is now indicated []  Organism is resistant to prescribed ED discharge antimicrobial []  Patient with positive blood cultures   Unable to contact patient after 3 attempts, letter will be sent to address on file  Meghan Benton 10/27/2021, 10:14 AM

## 2022-03-31 IMAGING — MR MR HEAD WO/W CM
13 series · 48 of 48 positions shown · IV contrast (multihance)
Comparison: Head CT March 23, 2020

CLINICAL DATA: Seizure.

EXAM:
MRI HEAD WITHOUT AND WITH CONTRAST
TECHNIQUE: Multiplanar, multiecho pulse sequences of the brain and surrounding
structures were obtained without and with intravenous contrast.
CONTRAST:  15mL MULTIHANCE GADOBENATE DIMEGLUMINE 529 MG/ML IV SOLN

[Series 2: T1 · sagittal · 5.0mm · 0.45mm/px · 1 of 21 slices shown]
[im 1/21]
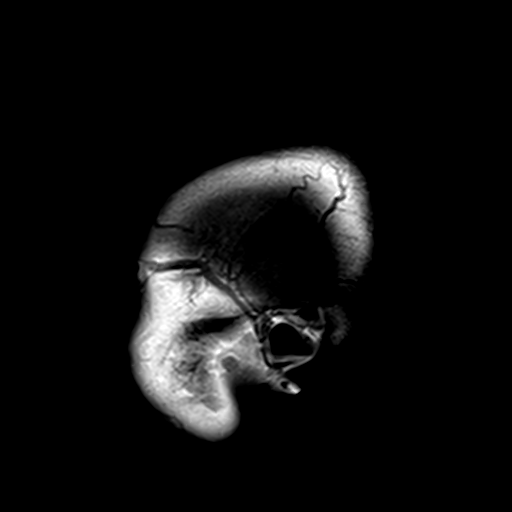

[Series 3: DWI · axial · 3.0mm · 1.80mm/px · z∈[-15,+118]mm · 5 of 92 slices shown (1 of 4)]
[im 1/92]
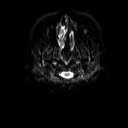
[im 23/92]
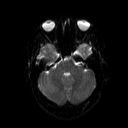
[im 46/92]
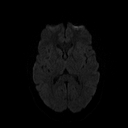
[im 69/92]
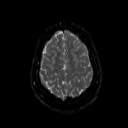
[im 92/92]
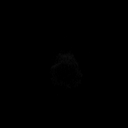

[Series 4: DWI · axial · 3.0mm · 1.80mm/px · z∈[-15,+118]mm · 3 of 46 slices shown (2 of 4)]
[im 1/46]
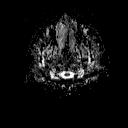
[im 23/46]
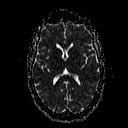
[im 46/46]
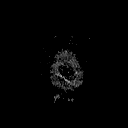

[Series 5: DWI · coronal · 5.0mm · 1.80mm/px · 5 of 66 slices shown (3 of 4)]
[im 1/66]
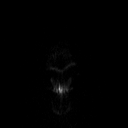
[im 17/66]
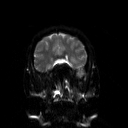
[im 33/66]
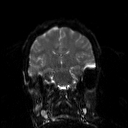
[im 49/66]
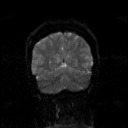
[im 66/66]
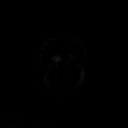

[Series 6: DWI · coronal · 5.0mm · 1.80mm/px · 3 of 36 slices shown (4 of 4)]
[im 1/36]
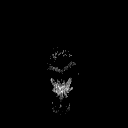
[im 18/36]
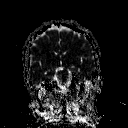
[im 36/36]
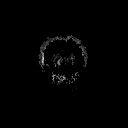

[Series 7: T2 · axial · 5.0mm · 0.60mm/px · z∈[-17,+123]mm · 2 of 22 slices shown (1 of 2)]
[im 1/22]
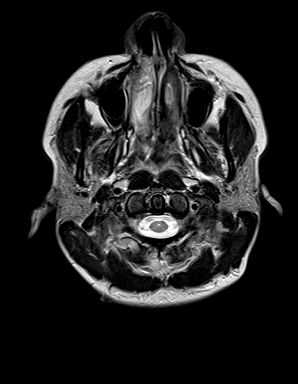
[im 22/22]
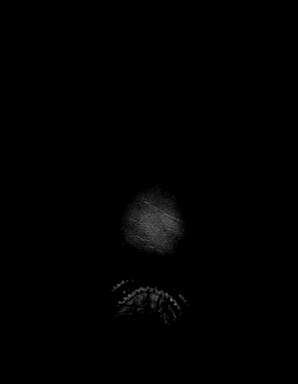

[Series 8: FLAIR · axial · 3.0mm · 0.45mm/px · z∈[-15,+118]mm · 2 of 30 slices shown (1 of 2)]
[im 1/30]
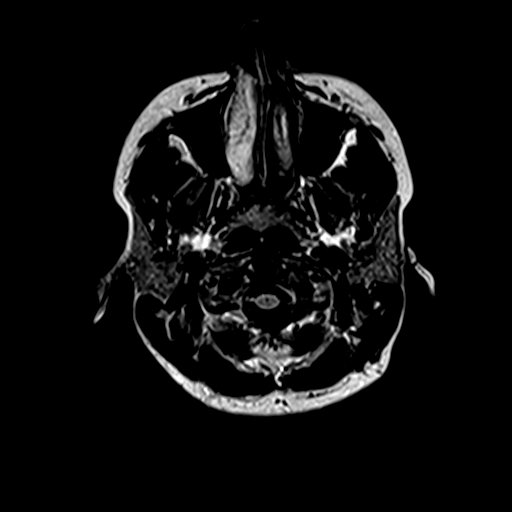
[im 30/30]
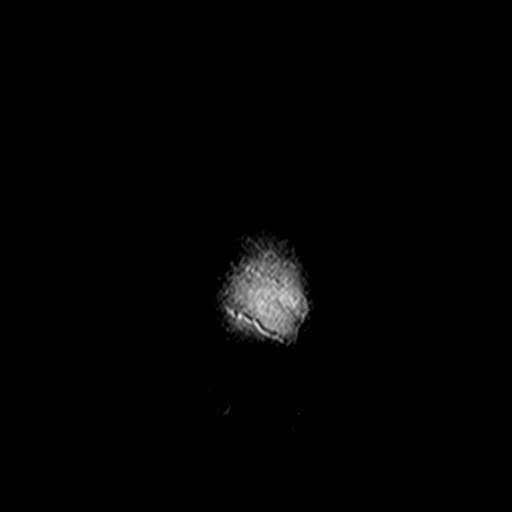

[Series 10: swi_images · axial · 4.0mm · 0.90mm/px · z∈[-17,+121]mm · 3 of 36 slices shown]
[im 1/36]
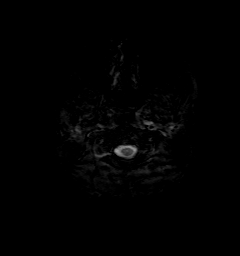
[im 18/36]
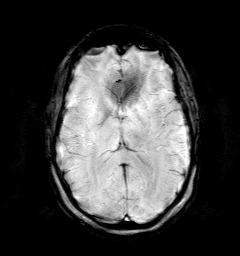
[im 36/36]
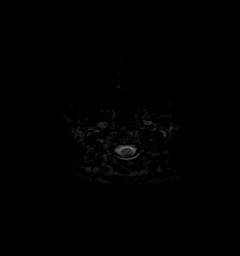

[Series 11: t1_mpr_tra · axial · 1.0mm · 0.75mm/px · z∈[-11,+115]mm · 9 of 128 slices shown]
[im 1/128]
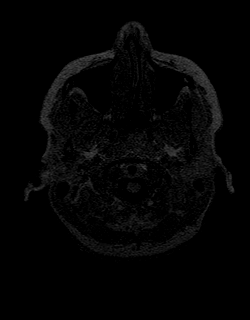
[im 16/128]
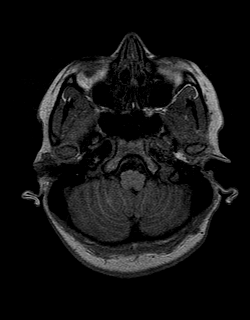
[im 32/128]
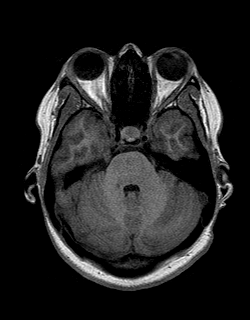
[im 48/128]
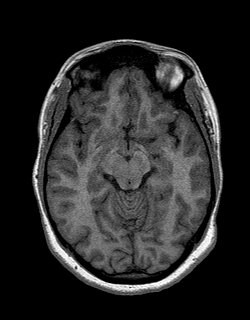
[im 64/128]
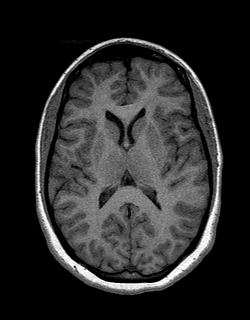
[im 80/128]
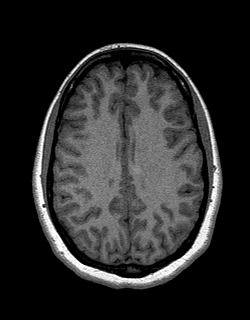
[im 96/128]
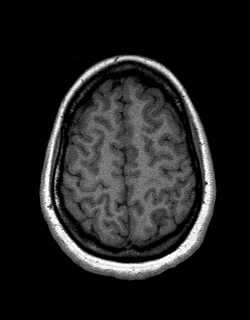
[im 112/128]
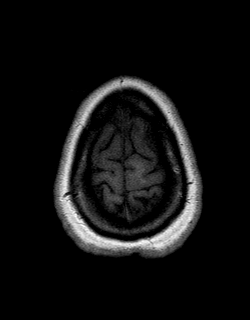
[im 128/128]
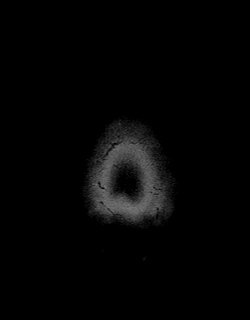

[Series 12: T2 · coronal · 3.0mm · 0.31mm/px · 2 of 28 slices shown (2 of 2)]
[im 1/28]
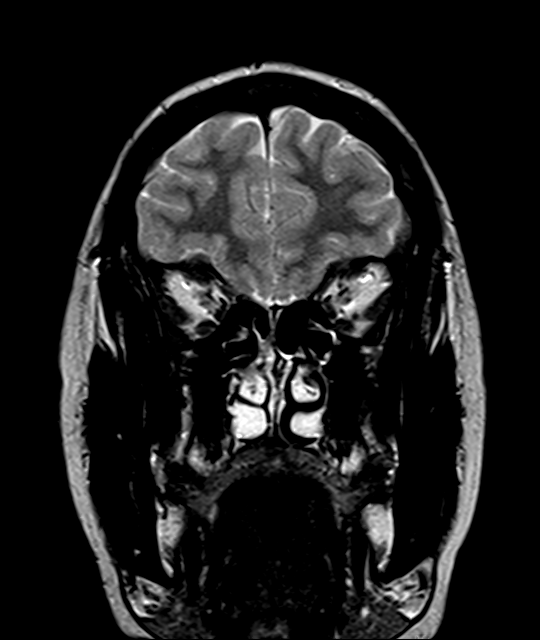
[im 28/28]
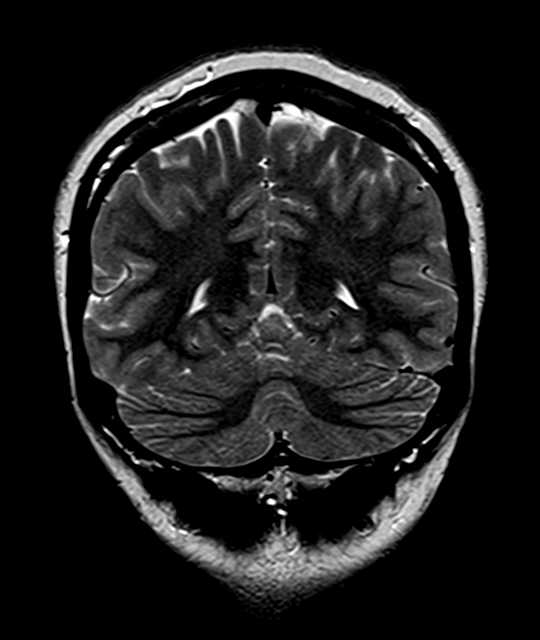

[Series 13: FLAIR · coronal · 3.0mm · 0.70mm/px · 2 of 28 slices shown (2 of 2)]
[im 1/28]
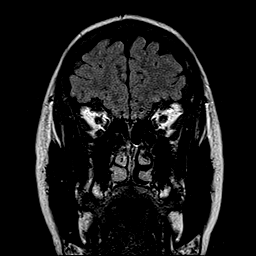
[im 28/28]
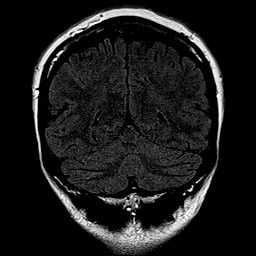

[Series 14: t1_mpr_tra post · axial · 1.0mm · 0.75mm/px · z∈[-11,+115]mm · 9 of 128 slices shown]
[im 1/128]
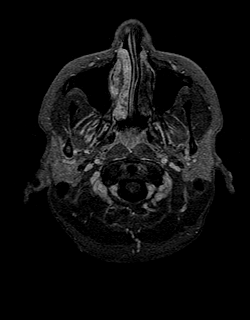
[im 16/128]
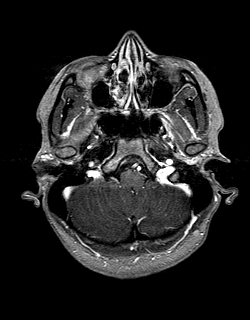
[im 32/128]
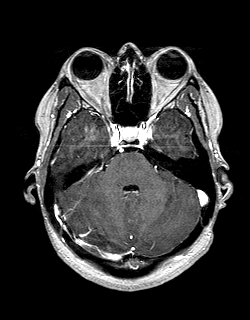
[im 48/128]
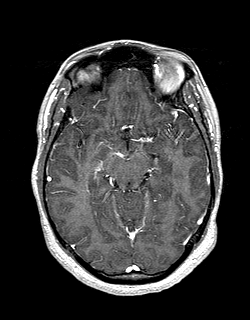
[im 64/128]
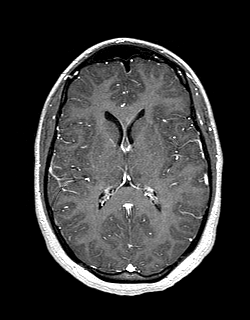
[im 80/128]
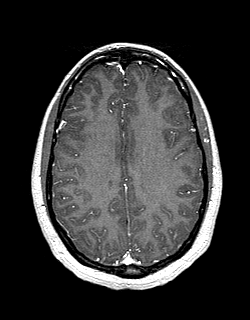
[im 96/128]
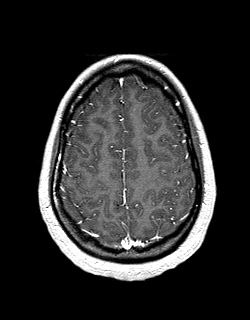
[im 112/128]
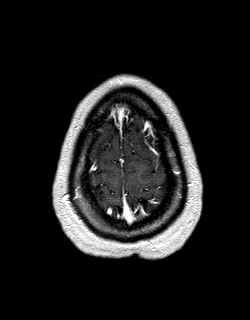
[im 128/128]
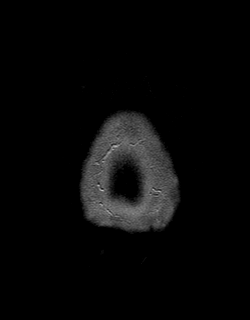

[Series 15: post cor · coronal · 5.0mm · 0.45mm/px · 2 of 27 slices shown]
[im 1/27]
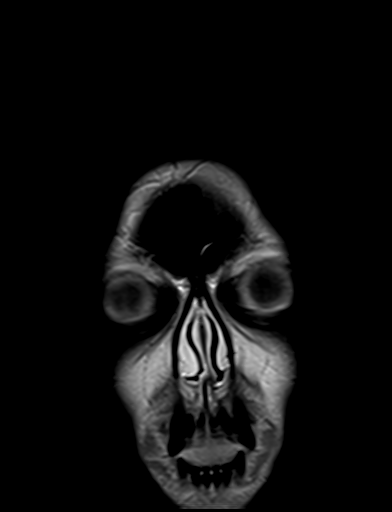
[im 27/27]
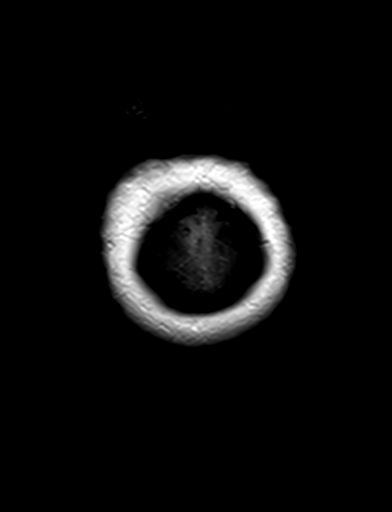

[48 of 48 positions shown; findings below may reference images not displayed]

FINDINGS: Brain: No acute infarction, hemorrhage, hydrocephalus, extra-axial
collection or mass lesion. The brain parenchyma has normal
morphology and signal characteristics. The mesial temporal lobes are
symmetric. No focus of abnormal contrast enhancement.

Vascular: Normal flow voids.

Skull and upper cervical spine: Normal marrow signal.

Sinuses/Orbits: Mild mucosal thickening of the ethmoid cells and
frontal sinuses. The orbits are maintained.
IMPRESSION: 1. Normal MRI of the brain.
2. Mild paranasal sinus disease.

## 2022-05-16 ENCOUNTER — Other Ambulatory Visit: Payer: Self-pay

## 2022-05-16 ENCOUNTER — Emergency Department (HOSPITAL_BASED_OUTPATIENT_CLINIC_OR_DEPARTMENT_OTHER)
Admission: EM | Admit: 2022-05-16 | Discharge: 2022-05-16 | Disposition: A | Discharge status: Home / Self Care | Payer: BC Managed Care – PPO | Attending: Emergency Medicine | Admitting: Emergency Medicine

## 2022-05-16 DIAGNOSIS — R102 Pelvic and perineal pain: Secondary | ICD-10-CM | POA: Diagnosis present

## 2022-05-16 DIAGNOSIS — N750 Cyst of Bartholin's gland: Secondary | ICD-10-CM | POA: Diagnosis not present

## 2022-05-16 MED ORDER — IBUPROFEN 600 MG PO TABS: 600.0000 mg | ORAL_TABLET | Freq: Four times a day (QID) | ORAL | 0 refills | Status: AC | PRN

## 2022-05-16 MED ORDER — SULFAMETHOXAZOLE-TRIMETHOPRIM 800-160 MG PO TABS: 1.0000 | ORAL_TABLET | Freq: Two times a day (BID) | ORAL | 0 refills | Status: AC

## 2022-05-16 MED ORDER — IBUPROFEN 800 MG PO TABS
800.0000 mg | ORAL_TABLET | Freq: Once | ORAL | Status: AC
  Administered 2022-05-16: 800 mg via ORAL
  Filled 2022-05-16: qty 1

## 2022-05-16 NOTE — ED Triage Notes (Signed)
C/O non-draining abscess on the inner part of left labia that she noticed on Monday.

## 2022-05-16 NOTE — Discharge Instructions (Addendum)
It was a pleasure taking care of you today. I have sent a prescription for antibiotics and pain control to your pharmacy. As discussed, please continue your sitz baths and attend the GYN appointment that you have this Friday to check resolution of symptoms. Seek emergency care if experiencing new or worsening symptoms.

## 2022-05-16 NOTE — ED Provider Notes (Signed)
[place] [facility] Provider Note   CSN: [id number] Arrival date & time: 05/16/22  1223     History  Chief Complaint  Patient presents with   Abscess    Meghan Benton is a 22 y.o. female who presents to ED complaining of painful cyst in genital area. Patient states that pain began around 1 week ago. She endorses troubles with sitting, walking, and sex due to pain. Pain has been managed with  Ibuprofen. LMP 05/11/2022. Patient is sexually active with one female. Denies fever, chills, chest pain, dyspnea, abdominal pain, vaginal discharge.   Abscess      Home Medications Prior to Admission medications   Medication Sig Start Date End Date Taking? Authorizing Provider  ibuprofen (ADVIL) 600 MG tablet Take 1 tablet (600 mg total) by mouth every 6 (six) hours as needed. 05/16/22  Yes Valrie Hart F, PA-C  sulfamethoxazole-trimethoprim (BACTRIM DS) 800-160 MG tablet Take 1 tablet by mouth 2 (two) times daily for 5 days. 05/16/22 05/21/22 Yes Valrie Hart F, PA-C  metroNIDAZOLE (FLAGYL) 500 MG tablet Take 1 tablet (500 mg total) by mouth 2 (two) times daily. 05/16/21   Zadie Rhine, MD  ondansetron (ZOFRAN) 4 MG tablet Take 1 tablet (4 mg total) by mouth every 8 (eight) hours as needed for nausea or vomiting. 11/01/20   Waldon Merl, PA-C  ondansetron (ZOFRAN) 4 MG tablet Take 1 tablet (4 mg total) by mouth every 8 (eight) hours as needed for nausea or vomiting. 10/24/21   Tanda Rockers A, DO  sucralfate (CARAFATE) 1 g tablet Take 1 tablet (1 g total) by mouth 4 (four) times daily -  with meals and at bedtime for 7 days. 10/24/21 10/31/21  Sloan Leiter, DO      Allergies    Patient has no known allergies.    Review of Systems   Review of Systems  Genitourinary:  Positive for vaginal pain.    Physical Exam Updated Vital Signs BP 118/78 (BP Location: Right Arm)   Pulse (!) 105   Temp 97.6 F (36.4 C) (Oral)   Resp 18   Ht   (1.702 m)   Wt 73.5 kg   SpO2 99%   BMI 25.37 kg/m  Physical Exam  ED Results / Procedures / Treatments   Labs (all labs ordered are listed, but only abnormal results are displayed) Labs Reviewed - No data to display  EKG None  Radiology No results found.  Procedures Procedures    Medications Ordered in ED Medications  ibuprofen (ADVIL) tablet 800 mg (800 mg Oral Given 05/16/22 1504)    ED Course/ Medical Decision Making/ A&P                             Medical Decision Making  This patient presents to the ED for concern of vaginal pain, this involves an extensive number of treatment options, and is a complaint that carries with it a high risk of complications and morbidity.  The differential diagnosis includes bartholin gland cyst, abscess, ingrown hair   Co morbidities that complicate the patient evaluation  none   Additional history obtained:  none    Problem List / ED Course / Critical interventions / Medication management  Patient presents to ED complaining of vaginal pain. Examination with chaperone [name] (RN) showed 2cm left bartholin gland cyst. Patient without systemic symptoms and small, simple appearance of bartholin gland cyst  has me less concerned about abscess at this time. I shared with patient that I could try to I&D the cyst if she would like me to, but I wouldn't expect to get much drainage given the cysts size. Patient stated that she would rather be sent home with antibiotics and follow up with her GYN appointment that is scheduled for this Friday. Patient's pain controlled on Ibuprofen, so I sent in prescription for Advil . Patient is stable for discharge at this time and given return precautions. Patient verbalized understanding of plan.   Social Determinants of Health:  none          Final Clinical Impression(s) / ED Diagnoses Final diagnoses:  Bartholin cyst    Rx / DC Orders ED Discharge Orders           Ordered    ibuprofen (ADVIL) 600 MG tablet  Every 6 hours PRN        05/16/22 1417    sulfamethoxazole-trimethoprim (BACTRIM DS) 800-160 MG tablet  2 times daily        05/16/22 1422              Dorthy Cooler, [place] 05/16/22 1731    Margarita Grizzle, MD 05/18/22 1646

## 2023-07-01 ENCOUNTER — Emergency Department (HOSPITAL_BASED_OUTPATIENT_CLINIC_OR_DEPARTMENT_OTHER)
Admission: EM | Admit: 2023-07-01 | Discharge: 2023-07-01 | Disposition: A | Discharge status: Home / Self Care | Attending: Emergency Medicine | Admitting: Emergency Medicine

## 2023-07-01 ENCOUNTER — Encounter (HOSPITAL_BASED_OUTPATIENT_CLINIC_OR_DEPARTMENT_OTHER): Payer: Self-pay

## 2023-07-01 ENCOUNTER — Other Ambulatory Visit: Payer: Self-pay

## 2023-07-01 DIAGNOSIS — J069 Acute upper respiratory infection, unspecified: Secondary | ICD-10-CM | POA: Diagnosis not present

## 2023-07-01 DIAGNOSIS — R059 Cough, unspecified: Secondary | ICD-10-CM | POA: Diagnosis present

## 2023-07-01 LAB — RESP PANEL BY RT-PCR (RSV, FLU A&B, COVID)  RVPGX2
Influenza A by PCR: NEGATIVE
Influenza B by PCR: NEGATIVE
Resp Syncytial Virus by PCR: NEGATIVE
SARS Coronavirus 2 by RT PCR: NEGATIVE

## 2023-07-01 LAB — GROUP A STREP BY PCR: Group A Strep by PCR: NOT DETECTED

## 2023-07-01 NOTE — ED Provider Notes (Signed)
 [place] [facility] Provider Note   CSN: [id number] Arrival date & time: 07/01/23  9147     History  Chief Complaint  Patient presents with   Sore Throat    Meghan Benton is a 23 y.o. female.   Sore Throat  Patient presents sore throats nasal congestion and cough.  Symptoms since yesterday.  Worried she could have strep throat.  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  No fevers.  Some pain with swallowing.     Home Medications Prior to Admission medications   Medication Sig Start Date End Date Taking? Authorizing Provider  ibuprofen  (ADVIL ) 600 MG tablet Take 1 tablet (600 mg total) by mouth every 6 (six) hours as needed. 05/16/22   [place]tario Bureau, PA-C  metroNIDAZOLE  (FLAGYL ) 500 MG tablet Take 1 tablet (500 mg total) by mouth 2 (two) times daily. 05/16/21   Eldon Greenland, MD  ondansetron  (ZOFRAN ) 4 MG tablet Take 1 tablet (4 mg total) by mouth every 8 (eight) hours as needed for nausea or vomiting. 11/01/20   Farris Hong, PA-C  ondansetron  (ZOFRAN ) 4 MG tablet Take 1 tablet (4 mg total) by mouth every 8 (eight) hours as needed for nausea or vomiting. 10/24/21   Teddi Favors, DO  sucralfate  (CARAFATE ) 1 g tablet Take 1 tablet (1 g total) by mouth 4 (four) times daily -  with meals and at bedtime for 7 days. 10/24/21 10/31/21  Teddi Favors, DO      Allergies    Patient has no known allergies.    Review of Systems   Review of Systems  Physical Exam Updated Vital Signs BP (!) 132/93   Pulse 78   Temp 98.1 F (36.7 C) (Oral)   Resp 18   Wt 81.6 kg   LMP 05/28/2023 (Exact Date)   SpO2 100%   BMI 28.19 kg/m  Physical Exam Vitals and nursing note reviewed.  HENT:     Nose: Congestion present.     Mouth/Throat:     Tonsils: No tonsillar exudate.     Comments: Posterior pharyngeal erythema without exudate. Cardiovascular:     Rate and Rhythm: Normal rate.  Pulmonary:      Breath sounds: No wheezing or rhonchi.  Abdominal:     Tenderness: There is no abdominal tenderness.  Neurological:     Mental Status: She is alert.     ED Results / Procedures / Treatments   Labs (all labs ordered are listed, but only abnormal results are displayed) Labs Reviewed  GROUP A STREP BY PCR  RESP PANEL BY RT-PCR (RSV, FLU A&B, COVID)  [id number]    EKG None  Radiology No results found.  Procedures Procedures    Medications Ordered in ED Medications - No data to display  ED Course/ Medical Decision Making/ A&P                                 Medical Decision Making  Patient with sore throat.  URI symptoms.    Will get strep test.  Lungs clear.  Doubt pneumonia.  Strep test negative.  Per protocol flu COVID RSV testing is been added by nursing.  It also was negative.  Will discharge home.        Final Clinical Impression(s) / ED Diagnoses Final diagnoses:  Upper respiratory tract infection, unspecified type  Rx / DC Orders ED Discharge Orders     None         Mozell Arias, MD 07/01/23 1039

## 2023-07-01 NOTE — ED Triage Notes (Signed)
 Reports congestion, sore throat, cough  Denies difficulty breathing, fevers
